# Patient Record
Sex: Female | Born: 1985 | Race: Black or African American | Hispanic: No | Marital: Single | Smoking: Current every day smoker
Health system: Southern US, Community
[De-identification: ages and names within clinical notes are randomized; demographics above are authoritative.]

## PROBLEM LIST (undated history)

## (undated) DIAGNOSIS — F32A Depression, unspecified: Secondary | ICD-10-CM

## (undated) DIAGNOSIS — D649 Anemia, unspecified: Secondary | ICD-10-CM

## (undated) DIAGNOSIS — J189 Pneumonia, unspecified organism: Secondary | ICD-10-CM

## (undated) DIAGNOSIS — I10 Essential (primary) hypertension: Secondary | ICD-10-CM

## (undated) DIAGNOSIS — J45909 Unspecified asthma, uncomplicated: Secondary | ICD-10-CM

## (undated) DIAGNOSIS — F329 Major depressive disorder, single episode, unspecified: Secondary | ICD-10-CM

## (undated) DIAGNOSIS — R51 Headache: Secondary | ICD-10-CM

## (undated) HISTORY — PX: DILATION AND CURETTAGE OF UTERUS: SHX78

---

## 2012-11-05 ENCOUNTER — Emergency Department (HOSPITAL_COMMUNITY)
Admission: EM | Admit: 2012-11-05 | Discharge: 2012-11-05 | Disposition: A | Discharge status: Home / Self Care | Payer: Self-pay | Attending: Emergency Medicine | Admitting: Emergency Medicine

## 2012-11-05 ENCOUNTER — Encounter (HOSPITAL_COMMUNITY): Payer: Self-pay | Admitting: Emergency Medicine

## 2012-11-05 DIAGNOSIS — J45909 Unspecified asthma, uncomplicated: Secondary | ICD-10-CM | POA: Insufficient documentation

## 2012-11-05 DIAGNOSIS — N760 Acute vaginitis: Secondary | ICD-10-CM | POA: Insufficient documentation

## 2012-11-05 DIAGNOSIS — F17200 Nicotine dependence, unspecified, uncomplicated: Secondary | ICD-10-CM | POA: Insufficient documentation

## 2012-11-05 DIAGNOSIS — Z79899 Other long term (current) drug therapy: Secondary | ICD-10-CM | POA: Insufficient documentation

## 2012-11-05 DIAGNOSIS — B9689 Other specified bacterial agents as the cause of diseases classified elsewhere: Secondary | ICD-10-CM

## 2012-11-05 HISTORY — DX: Unspecified asthma, uncomplicated: J45.909

## 2012-11-05 LAB — URINALYSIS, ROUTINE W REFLEX MICROSCOPIC
Bilirubin Urine: NEGATIVE
Glucose, UA: NEGATIVE mg/dL
Ketones, ur: NEGATIVE mg/dL
Nitrite: NEGATIVE
Protein, ur: NEGATIVE mg/dL
Specific Gravity, Urine: 1.028 (ref 1.005–1.030)
Urobilinogen, UA: 1 mg/dL (ref 0.0–1.0)
pH: 5.5 (ref 5.0–8.0)

## 2012-11-05 LAB — WET PREP, GENITAL
Trich, Wet Prep: NONE SEEN
Yeast Wet Prep HPF POC: NONE SEEN

## 2012-11-05 LAB — PREGNANCY, URINE: Preg Test, Ur: NEGATIVE

## 2012-11-05 LAB — URINE MICROSCOPIC-ADD ON

## 2012-11-05 MED ORDER — METRONIDAZOLE 500 MG PO TABS
500.0000 mg | ORAL_TABLET | Freq: Once | ORAL | Status: AC
  Administered 2012-11-05: 500 mg via ORAL
  Filled 2012-11-05: qty 1

## 2012-11-05 MED ORDER — DOXYCYCLINE HYCLATE 100 MG PO TABS
100.0000 mg | ORAL_TABLET | Freq: Once | ORAL | Status: AC
  Administered 2012-11-05: 100 mg via ORAL
  Filled 2012-11-05: qty 1

## 2012-11-05 MED ORDER — METRONIDAZOLE 500 MG PO TABS: 500.0000 mg | ORAL_TABLET | Freq: Two times a day (BID) | ORAL | Status: DC

## 2012-11-05 MED ORDER — DOXYCYCLINE HYCLATE 100 MG PO CAPS: 100.0000 mg | ORAL_CAPSULE | Freq: Two times a day (BID) | ORAL | Status: DC

## 2012-11-05 NOTE — ED Notes (Signed)
States had intercourse 2 weeks ago and condon broke and since she has had vag irritation and now has  Knots and swelling in groin, vag d/c itching a lot  States feels very different  From past yeast infections

## 2012-11-06 LAB — URINE CULTURE: Culture: NO GROWTH

## 2012-11-06 LAB — GC/CHLAMYDIA PROBE AMP
CT Probe RNA: NEGATIVE
GC Probe RNA: NEGATIVE

## 2012-11-06 NOTE — ED Provider Notes (Signed)
History     CSN: [id number]  Arrival date & time 11/05/12  0805   First MD Initiated Contact with Patient 11/05/12 0815      Chief Complaint  Patient presents with  . Vaginal Discharge    (Consider location/radiation/quality/duration/timing/severity/associated sxs/prior treatment) HPI The patient presents with a 2 week hx of vaginal discharge. The patient states that she was having sexual intercourse and the condom broke. The patient states that she has noted pain and irritation of the labia. The patient denies nausea, vomiting, fever, headache, weakness, abdominal pain, vaginal bleeding, or back pain. The patient states that she did not take any medications prior to arrival. The patient does not see on OB/GYN here in [place].  Past Medical History  Diagnosis Date  . Asthma     No past surgical history on file.  No family history on file.  History  Substance Use Topics  . Smoking status: Current Every Day Smoker  . Smokeless tobacco: Not on file  . Alcohol Use: Yes    OB History    Grav Para Term Preterm Abortions TAB SAB Ect Mult Living                  Review of Systems ROS  All other systems negative except as documented in the HPI. All pertinent positives and negatives as reviewed in the HPI.  Allergies  Shellfish allergy  Home Medications   Current Outpatient Rx  Name  Route  Sig  Dispense  Refill  . ALBUTEROL SULFATE HFA 108 (90 BASE) MCG/ACT IN AERS   Inhalation   Inhale 2 puffs into the lungs every 6 (six) hours as needed. For shortness of breath         . ALBUTEROL SULFATE (2.5 MG/3ML) 0.083% IN NEBU   Nebulization   Take 2.5 mg by nebulization every 6 (six) hours as needed. For shortness of breath         . DIPHENHYDRAMINE HCL 25 MG PO CAPS   Oral   Take 25 mg by mouth 2 (two) times daily as needed. For allergies: one capsule 1 hour before meal containing shellfish, 1 capsule 1 hour after meal         . DOXYCYCLINE HYCLATE 100 MG PO CAPS  Oral   Take 1 capsule (100 mg total) by mouth 2 (two) times daily.   28 capsule   0   . METRONIDAZOLE 500 MG PO TABS   Oral   Take 1 tablet (500 mg total) by mouth 2 (two) times daily.   28 tablet   0     BP 116/75  Pulse 76  Temp 97.9 F (36.6 C) (Oral)  Resp 18  SpO2 100%  Physical Exam  Nursing note and vitals reviewed. Constitutional: She is oriented to person, place, and time. She appears well-developed and well-nourished.  HENT:  Head: Normocephalic and atraumatic.  Eyes: Pupils are equal, round, and reactive to light.  Neck: Normal range of motion. Neck supple.  Cardiovascular: Normal rate, regular rhythm and normal heart sounds.  Exam reveals no gallop and no friction rub.   No murmur heard. Pulmonary/Chest: Effort normal and breath sounds normal. She has no wheezes.  Abdominal: Soft. Bowel sounds are normal. She exhibits no distension. There is no tenderness. There is no guarding.  Genitourinary: There is tenderness on the right labia. There is no rash or lesion on the right labia. There is tenderness on the left labia. There is no rash or lesion on  the left labia. Cervix exhibits discharge. Cervix exhibits no motion tenderness and no friability. Right adnexum displays no mass, no tenderness and no fullness. Left adnexum displays no mass, no tenderness and no fullness. There is tenderness around the vagina. No erythema or bleeding around the vagina. No foreign body around the vagina. Vaginal discharge found.  Neurological: She is alert and oriented to person, place, and time.  Skin: Skin is warm and dry. No rash noted.    ED Course  Procedures (including critical care time)  Labs Reviewed  URINALYSIS, ROUTINE W REFLEX MICROSCOPIC - Abnormal; Notable for the following:    APPearance CLOUDY (*)     Hgb urine dipstick MODERATE (*)     Leukocytes, UA MODERATE (*)     All other components within normal limits  WET PREP, GENITAL - Abnormal; Notable for the following:      Clue Cells Wet Prep HPF POC MANY (*)     WBC, Wet Prep HPF POC FEW (*)     All other components within normal limits  URINE MICROSCOPIC-ADD ON - Abnormal; Notable for the following:    Squamous Epithelial / LPF FEW (*)     Bacteria, UA FEW (*)     All other components within normal limits  PREGNANCY, URINE  GC/CHLAMYDIA PROBE AMP, GENITAL  URINE CULTURE  GC/CHLAMYDIA PROBE AMP  GC/CHLAMYDIA PROBE AMP   The patient will be treated for BV and possible STD. The patient is advised to return here as needed.   1. Bacterial vaginosis       MDM          Carlyle Dolly, PA-C 11/08/12 [phone]

## 2012-11-12 NOTE — ED Provider Notes (Signed)
Medical screening examination/treatment/procedure(s) were performed by non-physician practitioner and as supervising physician I was immediately available for consultation/collaboration.  Praise Dolecki R. Keiasia Christianson, MD 11/12/12 0751 

## 2012-12-31 NOTE — L&D Delivery Note (Signed)
Delivery Note At 9:24 PM a viable female was delivered via  (Presentation: ;  ).  APGAR: , ; weight .   Placenta status: , .  Cord:  with the following complications: .  Cord pH: not done  Anesthesia:   Episiotomy:  Lacerations:  Suture Repair: 2.0 Est. Blood Loss (mL):   Mom to postpartum.  Baby to nursery-stable.  MARSHALL,BERNARD A 09/14/2013, 9:33 PM

## 2013-02-05 ENCOUNTER — Emergency Department (HOSPITAL_COMMUNITY)
Admission: EM | Admit: 2013-02-05 | Discharge: 2013-02-05 | Disposition: A | Discharge status: Home / Self Care | Payer: Medicaid Other | Attending: Emergency Medicine | Admitting: Emergency Medicine

## 2013-02-05 ENCOUNTER — Encounter (HOSPITAL_COMMUNITY): Payer: Self-pay | Admitting: Cardiology

## 2013-02-05 DIAGNOSIS — O98819 Other maternal infectious and parasitic diseases complicating pregnancy, unspecified trimester: Secondary | ICD-10-CM | POA: Insufficient documentation

## 2013-02-05 DIAGNOSIS — Z3201 Encounter for pregnancy test, result positive: Secondary | ICD-10-CM

## 2013-02-05 DIAGNOSIS — Z79899 Other long term (current) drug therapy: Secondary | ICD-10-CM | POA: Insufficient documentation

## 2013-02-05 DIAGNOSIS — O99330 Smoking (tobacco) complicating pregnancy, unspecified trimester: Secondary | ICD-10-CM | POA: Insufficient documentation

## 2013-02-05 DIAGNOSIS — R110 Nausea: Secondary | ICD-10-CM | POA: Insufficient documentation

## 2013-02-05 DIAGNOSIS — B9689 Other specified bacterial agents as the cause of diseases classified elsewhere: Secondary | ICD-10-CM

## 2013-02-05 DIAGNOSIS — N912 Amenorrhea, unspecified: Secondary | ICD-10-CM | POA: Insufficient documentation

## 2013-02-05 DIAGNOSIS — J45909 Unspecified asthma, uncomplicated: Secondary | ICD-10-CM | POA: Insufficient documentation

## 2013-02-05 DIAGNOSIS — N760 Acute vaginitis: Secondary | ICD-10-CM | POA: Insufficient documentation

## 2013-02-05 LAB — URINALYSIS, ROUTINE W REFLEX MICROSCOPIC
Glucose, UA: NEGATIVE mg/dL
Hgb urine dipstick: NEGATIVE
Ketones, ur: 15 mg/dL — AB
Protein, ur: 30 mg/dL — AB
Urobilinogen, UA: 1 mg/dL (ref 0.0–1.0)

## 2013-02-05 LAB — URINE MICROSCOPIC-ADD ON

## 2013-02-05 MED ORDER — ONDANSETRON HCL 4 MG PO TABS: 4.0000 mg | ORAL_TABLET | Freq: Three times a day (TID) | ORAL | Status: DC | PRN

## 2013-02-05 MED ORDER — METRONIDAZOLE 500 MG PO TABS: 500.0000 mg | ORAL_TABLET | Freq: Two times a day (BID) | ORAL | Status: DC

## 2013-02-05 MED ORDER — CEFTRIAXONE SODIUM 250 MG IJ SOLR: 250.0000 mg | Freq: Once | INTRAMUSCULAR | Status: DC

## 2013-02-05 MED ORDER — AZITHROMYCIN 250 MG PO TABS: 1000.0000 mg | ORAL_TABLET | Freq: Once | ORAL | Status: DC

## 2013-02-05 MED ORDER — ONDANSETRON 4 MG PO TBDP
4.0000 mg | ORAL_TABLET | Freq: Once | ORAL | Status: AC
  Administered 2013-02-05: 4 mg via ORAL
  Filled 2013-02-05: qty 1

## 2013-02-05 NOTE — ED Notes (Signed)
Correction...POCT pregnacy test is POSITIVE.  [email or web address] emt

## 2013-02-05 NOTE — ED Notes (Signed)
Pt reports n/v for the past couple of weeks. States that she has been unable to keep any fluids down and just feeling sick on her stomach. Thinks that she could be pregnant and wants a pregnancy test.

## 2013-02-05 NOTE — ED Notes (Signed)
Pt discharged.Vital signs stable and GCS 15 

## 2013-02-05 NOTE — ED Provider Notes (Signed)
History     CSN: [id number]  Arrival date & time 02/05/13  1739   First MD Initiated Contact with Patient 02/05/13 2053      Chief Complaint  Patient presents with  . Emesis    (Consider location/radiation/quality/duration/timing/severity/associated sxs/prior treatment) HPI Comments: 28 y.o. Female  [id number] presents today after completing a positive home pregnancy test. She would like confirmation of the pregnancy and treatment for nausea. She has taken no interventions for the nausea. Nothing makes it better. Pt admits unprotected sex, amenorrhea, lack of appetite over the last few weeks, and some mild abdominal cramping. Pt denies vaginal discharge/bleeding/odor, hematuria, vomiting, or diarrhea. Pt states LMP was in December.   Obstetrics history includes a miscarriage within 6 weeks of conception followed 2 term births (uncomplicated, NSVD).       Past Medical History  Diagnosis Date  . Asthma     History reviewed. No pertinent past surgical history.  History reviewed. No pertinent family history.  History  Substance Use Topics  . Smoking status: Current Every Day Smoker  . Smokeless tobacco: Not on file  . Alcohol Use: Yes    OB History    Grav Para Term Preterm Abortions TAB SAB Ect Mult Living                  Review of Systems  Constitutional: Positive for appetite change. Negative for fever and diaphoresis.       Anorexia  HENT: Negative for neck pain and neck stiffness.   Eyes: Negative for visual disturbance.  Respiratory: Negative for apnea, chest tightness and shortness of breath.   Cardiovascular: Negative for chest pain and palpitations.  Gastrointestinal: Positive for nausea. Negative for vomiting, diarrhea and constipation.       Mild abdominal cramping  Genitourinary: Positive for menstrual problem. Negative for dysuria, hematuria, vaginal bleeding, vaginal discharge, vaginal pain and pelvic pain.       Amenorrhea  Musculoskeletal: Negative for  gait problem.  Skin: Negative for rash.  Neurological: Negative for dizziness, weakness, light-headedness, numbness and headaches.    Allergies  Shellfish allergy  Home Medications   Current Outpatient Rx  Name  Route  Sig  Dispense  Refill  . ALBUTEROL SULFATE HFA 108 (90 BASE) MCG/ACT IN AERS   Inhalation   Inhale 2 puffs into the lungs every 6 (six) hours as needed. For shortness of breath         . ALBUTEROL SULFATE (2.5 MG/3ML) 0.083% IN NEBU   Nebulization   Take 2.5 mg by nebulization every 6 (six) hours as needed. For shortness of breath         . DIPHENHYDRAMINE HCL 25 MG PO CAPS   Oral   Take 25 mg by mouth 2 (two) times daily as needed. For allergies: one capsule 1 hour before meal containing shellfish, 1 capsule 1 hour after meal         . DOXYCYCLINE HYCLATE 100 MG PO CAPS   Oral   Take 1 capsule (100 mg total) by mouth 2 (two) times daily.   28 capsule   0   . METRONIDAZOLE 500 MG PO TABS   Oral   Take 1 tablet (500 mg total) by mouth 2 (two) times daily.   28 tablet   0     BP 105/75  Pulse 94  Temp 98.6 F (37 C) (Oral)  Resp 18  SpO2 100%  LMP 01/07/2013  Physical Exam  Nursing note and vitals reviewed. Constitutional:  She is oriented to person, place, and time. She appears well-developed and well-nourished. No distress.  HENT:  Head: Normocephalic and atraumatic.  Eyes: Conjunctivae normal and EOM are normal.  Neck: Normal range of motion. Neck supple.       No meningeal signs  Cardiovascular: Normal rate, regular rhythm and normal heart sounds.  Exam reveals no gallop and no friction rub.   No murmur heard. Pulmonary/Chest: Effort normal and breath sounds normal. No respiratory distress. She has no wheezes. She has no rales. She exhibits no tenderness.  Abdominal: Soft. Bowel sounds are normal. She exhibits no distension. There is no tenderness. There is no rebound and no guarding.  Genitourinary: Vagina normal. No vaginal discharge  found.       White cervical discharge, no cervical motion tenderness, no adnexal tenderness, no external lesions  Musculoskeletal: Normal range of motion. She exhibits no edema and no tenderness.  Neurological: She is alert and oriented to person, place, and time. No cranial nerve deficit.  Skin: Skin is warm and dry. She is not diaphoretic. No erythema.    ED Course  Procedures (including critical care time)  Labs Reviewed  URINALYSIS, ROUTINE W REFLEX MICROSCOPIC - Abnormal; Notable for the following:    Color, Urine AMBER (*)  BIOCHEMICALS MAY BE AFFECTED BY COLOR   APPearance CLOUDY (*)     Specific Gravity, Urine 1.035 (*)     Bilirubin Urine SMALL (*)     Ketones, ur 15 (*)     Protein, ur 30 (*)     Leukocytes, UA SMALL (*)     All other components within normal limits  POCT PREGNANCY, URINE - Abnormal; Notable for the following:    Preg Test, Ur POSITIVE (*)     All other components within normal limits  URINE MICROSCOPIC-ADD ON - Abnormal; Notable for the following:    Squamous Epithelial / LPF MANY (*)     Bacteria, UA FEW (*)     All other components within normal limits  POCT PREGNANCY, URINE   No results found. Results for orders placed during the hospital encounter of 02/05/13  URINALYSIS, ROUTINE W REFLEX MICROSCOPIC      Component Value Range   Color, Urine AMBER (*) YELLOW   APPearance CLOUDY (*) CLEAR   Specific Gravity, Urine 1.035 (*) 1.005 - 1.030   pH 5.5  5.0 - 8.0   Glucose, UA NEGATIVE  NEGATIVE mg/dL   Hgb urine dipstick NEGATIVE  NEGATIVE   Bilirubin Urine SMALL (*) NEGATIVE   Ketones, ur 15 (*) NEGATIVE mg/dL   Protein, ur 30 (*) NEGATIVE mg/dL   Urobilinogen, UA 1.0  0.0 - 1.0 mg/dL   Nitrite NEGATIVE  NEGATIVE   Leukocytes, UA SMALL (*) NEGATIVE  POCT PREGNANCY, URINE      Component Value Range   Preg Test, Ur POSITIVE (*) NEGATIVE  POCT PREGNANCY, URINE      Component Value Range   Preg Test, Ur POSITIVE (*) NEGATIVE  URINE  MICROSCOPIC-ADD ON      Component Value Range   Squamous Epithelial / LPF MANY (*) RARE   WBC, UA 0-2  <3 WBC/hpf   RBC / HPF 0-2  <3 RBC/hpf   Bacteria, UA FEW (*) RARE  GC/CHLAMYDIA PROBE AMP      Component Value Range   CT Probe RNA NEGATIVE  NEGATIVE   GC Probe RNA NEGATIVE  NEGATIVE  WET PREP, GENITAL      Component Value Range  Yeast Wet Prep HPF POC NONE SEEN  NONE SEEN   Trich, Wet Prep NONE SEEN  NONE SEEN   Clue Cells Wet Prep HPF POC FEW (*) NONE SEEN   WBC, Wet Prep HPF POC FEW (*) NONE SEEN     No diagnosis found.    MDM  Pt c/o cramping so will do pelvic exam to see if clinical suspicion for ectopic is raised. Pelvic exam negative for CMT/adenexal tenderness, positive for white cervical discharge. Wet prep positive for clue cells. Will prescribe Flagyl and Zofran. Pt asked for information on elective termination. Also requested STD testing once boyfriend stepped out of room. Provided resource guide.  At this time there does not appear to be any evidence of an acute emergency medical condition and the patient appears stable for discharge with appropriate outpatient follow up.Diagnosis was discussed with patient who verbalizes understanding and is agreeable to discharge. Pt case discussed with Dr. Rubin Payor who agrees with my plan.    Glade Nurse, PA-C 02/06/13 2150

## 2013-02-06 LAB — GC/CHLAMYDIA PROBE AMP
CT Probe RNA: NEGATIVE
GC Probe RNA: NEGATIVE

## 2013-02-09 NOTE — ED Provider Notes (Signed)
Medical screening examination/treatment/procedure(s) were performed by non-physician practitioner and as supervising physician I was immediately available for consultation/collaboration.  Juliet Rude. Rubin Payor, MD 02/09/13 2011

## 2013-03-02 LAB — OB RESULTS CONSOLE RUBELLA ANTIBODY, IGM: Rubella: IMMUNE

## 2013-03-02 LAB — OB RESULTS CONSOLE ABO/RH: RH Type: POSITIVE

## 2013-03-02 LAB — OB RESULTS CONSOLE RPR: RPR: NONREACTIVE

## 2013-03-02 LAB — OB RESULTS CONSOLE HEPATITIS B SURFACE ANTIGEN: Hepatitis B Surface Ag: NEGATIVE

## 2013-03-02 LAB — OB RESULTS CONSOLE HIV ANTIBODY (ROUTINE TESTING): HIV: NONREACTIVE

## 2013-03-30 LAB — OB RESULTS CONSOLE ANTIBODY SCREEN: Antibody Screen: NEGATIVE

## 2013-05-26 ENCOUNTER — Other Ambulatory Visit: Payer: Self-pay | Admitting: Obstetrics

## 2013-05-26 DIAGNOSIS — N6320 Unspecified lump in the left breast, unspecified quadrant: Secondary | ICD-10-CM

## 2013-06-01 ENCOUNTER — Other Ambulatory Visit: Payer: Self-pay | Admitting: Obstetrics

## 2013-06-01 ENCOUNTER — Ambulatory Visit
Admission: RE | Admit: 2013-06-01 | Discharge: 2013-06-01 | Disposition: A | Discharge status: Home / Self Care | Payer: Medicaid Other | Source: Ambulatory Visit | Attending: Obstetrics | Admitting: Obstetrics

## 2013-06-01 DIAGNOSIS — N6320 Unspecified lump in the left breast, unspecified quadrant: Secondary | ICD-10-CM

## 2013-06-03 HISTORY — PX: BREAST BIOPSY: SHX20

## 2013-09-10 ENCOUNTER — Encounter (HOSPITAL_COMMUNITY): Payer: Self-pay | Admitting: Emergency Medicine

## 2013-09-10 ENCOUNTER — Emergency Department (HOSPITAL_COMMUNITY)
Admission: EM | Admit: 2013-09-10 | Discharge: 2013-09-10 | Disposition: A | Discharge status: Home / Self Care | Payer: Medicaid Other | Attending: Emergency Medicine | Admitting: Emergency Medicine

## 2013-09-10 DIAGNOSIS — O99330 Smoking (tobacco) complicating pregnancy, unspecified trimester: Secondary | ICD-10-CM | POA: Insufficient documentation

## 2013-09-10 DIAGNOSIS — IMO0002 Reserved for concepts with insufficient information to code with codable children: Secondary | ICD-10-CM | POA: Insufficient documentation

## 2013-09-10 DIAGNOSIS — Z3490 Encounter for supervision of normal pregnancy, unspecified, unspecified trimester: Secondary | ICD-10-CM

## 2013-09-10 DIAGNOSIS — Z79899 Other long term (current) drug therapy: Secondary | ICD-10-CM | POA: Insufficient documentation

## 2013-09-10 DIAGNOSIS — W57XXXA Bitten or stung by nonvenomous insect and other nonvenomous arthropods, initial encounter: Secondary | ICD-10-CM

## 2013-09-10 DIAGNOSIS — J45909 Unspecified asthma, uncomplicated: Secondary | ICD-10-CM | POA: Insufficient documentation

## 2013-09-10 DIAGNOSIS — S60569A Insect bite (nonvenomous) of unspecified hand, initial encounter: Secondary | ICD-10-CM | POA: Insufficient documentation

## 2013-09-10 DIAGNOSIS — Y929 Unspecified place or not applicable: Secondary | ICD-10-CM | POA: Insufficient documentation

## 2013-09-10 DIAGNOSIS — O9989 Other specified diseases and conditions complicating pregnancy, childbirth and the puerperium: Secondary | ICD-10-CM | POA: Insufficient documentation

## 2013-09-10 DIAGNOSIS — Y939 Activity, unspecified: Secondary | ICD-10-CM | POA: Insufficient documentation

## 2013-09-10 MED ORDER — PERMETHRIN 5 % EX CREA: TOPICAL_CREAM | CUTANEOUS | Status: DC

## 2013-09-10 MED ORDER — DIPHENHYDRAMINE HCL 50 MG/ML IJ SOLN
25.0000 mg | Freq: Once | INTRAMUSCULAR | Status: AC
  Administered 2013-09-10: 25 mg via INTRAMUSCULAR
  Filled 2013-09-10: qty 1

## 2013-09-10 MED ORDER — DIPHENHYDRAMINE HCL 25 MG PO CAPS: 25.0000 mg | ORAL_CAPSULE | Freq: Four times a day (QID) | ORAL | Status: DC | PRN

## 2013-09-10 NOTE — ED Provider Notes (Signed)
CSN: [id number]     Arrival date & time 09/10/13  1338 History  This chart was scribed for non-physician practitioner Marlon Pel, PA-C, working with Gwyneth Sprout, MD by Dorothey Baseman, ED Scribe. This patient was seen in room TR07C/TR07C and the patient's care was started at 3:09 PM.     Chief Complaint  Patient presents with  . Rash    The history is provided by the patient. No language interpreter was used.   HPI Comments: Nicole Fry is a 28 y.o. female who presents to the Emergency Department complaining of a rash to her left palm and foot described as burning and itching that exacerbates when walking with associated swelling. She reports that he fingers have began to feel numb today. She reports that she may have been bitten by a a spider. Patient reports her son had a similar rash a few days ago that she treated with Hydrocortisone cream and Benadryl with relief. She states that she has tried Hydrocortisone cream and itching powder without relief and that she was unsure if she can take Benadryl. Patient denies any other symptoms at this time. Patient is currently 9 months pregnant.  Past Medical History  Diagnosis Date  . Asthma    History reviewed. No pertinent past surgical history. History reviewed. No pertinent family history. History  Substance Use Topics  . Smoking status: Current Every Day Smoker  . Smokeless tobacco: Not on file  . Alcohol Use: Yes   OB History   Grav Para Term Preterm Abortions TAB SAB Ect Mult Living                 Review of Systems  A complete 10 system review of systems was obtained and all systems are negative except as noted in the HPI and PMH.   Allergies  Shellfish allergy  Home Medications   Current Outpatient Rx  Name  Route  Sig  Dispense  Refill  . albuterol (PROVENTIL HFA;VENTOLIN HFA) 108 (90 BASE) MCG/ACT inhaler   Inhalation   Inhale 2 puffs into the lungs every 6 (six) hours as needed for wheezing or shortness of breath  (coughing). For shortness of breath         . Calcium Carbonate Antacid (TUMS PO)   Oral   Take 3 tablets by mouth 2 (two) times daily as needed (heartburn).         . Prenatal Vit-Fe Sulfate-FA (PRENATAL VITAMIN PO)   Oral   Take 1 tablet by mouth daily.         . diphenhydrAMINE (BENADRYL) 25 mg capsule   Oral   Take 1 capsule (25 mg total) by mouth every 6 (six) hours as needed for itching.   10 capsule   0   . permethrin (ELIMITE) 5 % cream      Apply to affected area once and leave for 14 hours   60 g   1    Triage Vitals: BP 121/80  Pulse 114  Temp(Src) 98.3 F (36.8 C) (Oral)  Resp 18  SpO2 99%  Physical Exam  Nursing note and vitals reviewed. Constitutional: She is oriented to person, place, and time. She appears well-developed and well-nourished. No distress.  HENT:  Head: Normocephalic and atraumatic.  Eyes: Conjunctivae are normal.  Neck: Normal range of motion. Neck supple.  Musculoskeletal: Normal range of motion.  Neurological: She is alert and oriented to person, place, and time.  Skin: Skin is warm and dry. Rash noted.  Rash to  palm of left hand and bottom of left foot. Mild swelling to both areas noted.  Psychiatric: She has a normal mood and affect. Her behavior is normal.     ED Course  Procedures (including critical care time)  DIAGNOSTIC STUDIES: Oxygen Saturation is 99% on room air, normal by my interpretation.    COORDINATION OF CARE: 3:12PM- Will order a shot of Benadryl. Will discharge patient with Permethrin and advised her of proper use. Advised patient to stop scratching until symptoms subside. Discussed treatment plan with patient at bedside and patient verbalized agreement.     Labs Review Labs Reviewed - No data to display Imaging Review No results found.  MDM   1. Pregnant   2. Insect bites    [employer] reports the bendaryl is category B. Pt advised to take it only if she REALLY needs it.   Given Rx for  Permethrin cream which is also Category B.  27 y.o.Nicole Fry evaluation in the Emergency Department is complete. It has been determined that no acute conditions requiring further emergency intervention are present at this time. The patient/guardian have been advised of the diagnosis and plan. We have discussed signs and symptoms that warrant return to the ED, such as changes or worsening in symptoms.  Vital signs are stable at discharge. Filed Vitals:   09/10/13 1343  BP: 121/80  Pulse: 114  Temp: 98.3 F (36.8 C)  Resp: 18    Patient/guardian has voiced understanding and agreed to follow-up with the PCP or specialist.  I personally performed the services described in this documentation, which was scribed in my presence. The recorded information has been reviewed and is accurate.     Dorthula Matas, PA-C 09/10/13 1521

## 2013-09-10 NOTE — ED Provider Notes (Signed)
Medical screening examination/treatment/procedure(s) were performed by non-physician practitioner and as supervising physician I was immediately available for consultation/collaboration.   Gwyneth Sprout, MD 09/10/13 [phone]

## 2013-09-10 NOTE — ED Notes (Signed)
As per triage. Pt is in no distress.

## 2013-09-10 NOTE — ED Notes (Signed)
Pt c/o painful, burning rash to hands, feet, and mouth; pt sts son had similar; pt is 9 months pregnant at present

## 2013-09-14 ENCOUNTER — Inpatient Hospital Stay (HOSPITAL_COMMUNITY)
Admission: AD | Admit: 2013-09-14 | Discharge: 2013-09-16 | DRG: 775 | Disposition: A | Discharge status: Home / Self Care | Payer: Medicaid Other | Source: Ambulatory Visit | Attending: Obstetrics | Admitting: Obstetrics

## 2013-09-14 ENCOUNTER — Inpatient Hospital Stay (HOSPITAL_COMMUNITY): Payer: Medicaid Other | Admitting: Anesthesiology

## 2013-09-14 ENCOUNTER — Encounter (HOSPITAL_COMMUNITY): Payer: Self-pay

## 2013-09-14 ENCOUNTER — Encounter (HOSPITAL_COMMUNITY): Payer: Self-pay | Admitting: Anesthesiology

## 2013-09-14 DIAGNOSIS — Z22330 Carrier of Group B streptococcus: Secondary | ICD-10-CM

## 2013-09-14 DIAGNOSIS — O99892 Other specified diseases and conditions complicating childbirth: Principal | ICD-10-CM | POA: Diagnosis present

## 2013-09-14 HISTORY — DX: Headache: R51

## 2013-09-14 HISTORY — DX: Essential (primary) hypertension: I10

## 2013-09-14 HISTORY — DX: Depression, unspecified: F32.A

## 2013-09-14 HISTORY — DX: Major depressive disorder, single episode, unspecified: F32.9

## 2013-09-14 HISTORY — DX: Anemia, unspecified: D64.9

## 2013-09-14 LAB — CBC
Hemoglobin: 10.8 g/dL — ABNORMAL LOW (ref 12.0–15.0)
MCH: 30.1 pg (ref 26.0–34.0)
MCHC: 33 g/dL (ref 30.0–36.0)
Platelets: 202 10*3/uL (ref 150–400)

## 2013-09-14 LAB — RPR: RPR Ser Ql: NONREACTIVE

## 2013-09-14 MED ORDER — CITRIC ACID-SODIUM CITRATE 334-500 MG/5ML PO SOLN: 30.0000 mL | ORAL | Status: DC | PRN

## 2013-09-14 MED ORDER — FENTANYL 2.5 MCG/ML BUPIVACAINE 1/10 % EPIDURAL INFUSION (WH - ANES)
INTRAMUSCULAR | Status: DC | PRN
  Administered 2013-09-14: 14 mL/h via EPIDURAL

## 2013-09-14 MED ORDER — OXYTOCIN 40 UNITS IN LACTATED RINGERS INFUSION - SIMPLE MED
1.0000 m[IU]/min | INTRAVENOUS | Status: DC
  Administered 2013-09-14: 2 m[IU]/min via INTRAVENOUS
  Filled 2013-09-14: qty 1000

## 2013-09-14 MED ORDER — PHENYLEPHRINE 40 MCG/ML (10ML) SYRINGE FOR IV PUSH (FOR BLOOD PRESSURE SUPPORT)
80.0000 ug | PREFILLED_SYRINGE | INTRAVENOUS | Status: DC | PRN
  Filled 2013-09-14: qty 2

## 2013-09-14 MED ORDER — FENTANYL 2.5 MCG/ML BUPIVACAINE 1/10 % EPIDURAL INFUSION (WH - ANES)
14.0000 mL/h | INTRAMUSCULAR | Status: DC | PRN
  Filled 2013-09-14: qty 125

## 2013-09-14 MED ORDER — LACTATED RINGERS IV SOLN
INTRAVENOUS | Status: DC
  Administered 2013-09-14: 15:00:00 via INTRAVENOUS

## 2013-09-14 MED ORDER — EPHEDRINE 5 MG/ML INJ
10.0000 mg | INTRAVENOUS | Status: DC | PRN
  Filled 2013-09-14: qty 2
  Filled 2013-09-14: qty 4

## 2013-09-14 MED ORDER — LIDOCAINE HCL (PF) 1 % IJ SOLN
30.0000 mL | INTRAMUSCULAR | Status: DC | PRN
  Filled 2013-09-14 (×2): qty 30

## 2013-09-14 MED ORDER — OXYCODONE-ACETAMINOPHEN 5-325 MG PO TABS: 1.0000 | ORAL_TABLET | ORAL | Status: DC | PRN

## 2013-09-14 MED ORDER — FLEET ENEMA 7-19 GM/118ML RE ENEM
1.0000 | ENEMA | RECTAL | Status: DC | PRN
Start: 2013-09-14 — End: 2013-09-15

## 2013-09-14 MED ORDER — SODIUM CHLORIDE 0.9 % IV SOLN: 1.0000 g | Freq: Once | INTRAVENOUS | Status: DC

## 2013-09-14 MED ORDER — OXYTOCIN 40 UNITS IN LACTATED RINGERS INFUSION - SIMPLE MED: 62.5000 mL/h | INTRAVENOUS | Status: DC

## 2013-09-14 MED ORDER — PHENYLEPHRINE 40 MCG/ML (10ML) SYRINGE FOR IV PUSH (FOR BLOOD PRESSURE SUPPORT)
80.0000 ug | PREFILLED_SYRINGE | INTRAVENOUS | Status: DC | PRN
  Filled 2013-09-14: qty 2
  Filled 2013-09-14: qty 5

## 2013-09-14 MED ORDER — LACTATED RINGERS IV SOLN: 500.0000 mL | INTRAVENOUS | Status: DC | PRN

## 2013-09-14 MED ORDER — BUTORPHANOL TARTRATE 1 MG/ML IJ SOLN: 1.0000 mg | INTRAMUSCULAR | Status: DC | PRN

## 2013-09-14 MED ORDER — EPHEDRINE 5 MG/ML INJ
10.0000 mg | INTRAVENOUS | Status: DC | PRN
  Filled 2013-09-14: qty 2

## 2013-09-14 MED ORDER — SODIUM CHLORIDE 0.9 % IV SOLN
2.0000 g | Freq: Four times a day (QID) | INTRAVENOUS | Status: DC
  Administered 2013-09-14: 2 g via INTRAVENOUS
  Filled 2013-09-14 (×3): qty 2000

## 2013-09-14 MED ORDER — ONDANSETRON HCL 4 MG/2ML IJ SOLN: 4.0000 mg | Freq: Four times a day (QID) | INTRAMUSCULAR | Status: DC | PRN

## 2013-09-14 MED ORDER — OXYTOCIN BOLUS FROM INFUSION
500.0000 mL | INTRAVENOUS | Status: DC
  Administered 2013-09-14: 500 mL via INTRAVENOUS

## 2013-09-14 MED ORDER — LACTATED RINGERS IV SOLN
500.0000 mL | Freq: Once | INTRAVENOUS | Status: AC
  Administered 2013-09-14: 500 mL via INTRAVENOUS

## 2013-09-14 MED ORDER — TERBUTALINE SULFATE 1 MG/ML IJ SOLN: 0.2500 mg | Freq: Once | INTRAMUSCULAR | Status: AC | PRN

## 2013-09-14 MED ORDER — LIDOCAINE HCL (PF) 1 % IJ SOLN
INTRAMUSCULAR | Status: DC | PRN
  Administered 2013-09-14 (×2): 4 mL

## 2013-09-14 MED ORDER — ACETAMINOPHEN 325 MG PO TABS: 650.0000 mg | ORAL_TABLET | ORAL | Status: DC | PRN

## 2013-09-14 MED ORDER — DIPHENHYDRAMINE HCL 50 MG/ML IJ SOLN: 12.5000 mg | INTRAMUSCULAR | Status: DC | PRN

## 2013-09-14 MED ORDER — IBUPROFEN 600 MG PO TABS
600.0000 mg | ORAL_TABLET | Freq: Four times a day (QID) | ORAL | Status: DC | PRN
  Administered 2013-09-14: 600 mg via ORAL
  Filled 2013-09-14: qty 1

## 2013-09-14 NOTE — Anesthesia Procedure Notes (Signed)
Epidural Patient location during procedure: OB Start time: 09/14/2013 7:59 PM  Staffing Anesthesiologist: Charniece Venturino A. Performed by: anesthesiologist   Preanesthetic Checklist Completed: patient identified, site marked, surgical consent, pre-op evaluation, timeout performed, IV checked, risks and benefits discussed and monitors and equipment checked  Epidural Patient position: sitting Prep: site prepped and draped and DuraPrep Patient monitoring: continuous pulse ox and blood pressure Approach: midline Injection technique: LOR air  Needle:  Needle type: Tuohy  Needle gauge: 17 G Needle length: 9 cm and 9 Needle insertion depth: 4 cm Catheter type: closed end flexible Catheter size: 19 Gauge Catheter at skin depth: 9 cm Test dose: negative and Other  Assessment Events: blood not aspirated, injection not painful, no injection resistance, negative IV test and no paresthesia  Additional Notes Patient identified. Risks and benefits discussed including failed block, incomplete  Pain control, post dural puncture headache, nerve damage, paralysis, blood pressure Changes, nausea, vomiting, reactions to medications-both toxic and allergic and post Partum back pain. All questions were answered. Patient expressed understanding and wished to proceed. Sterile technique was used throughout procedure. Epidural site was Dressed with sterile barrier dressing. No paresthesias, signs of intravascular injection Or signs of intrathecal spread were encountered.  Patient was more comfortable after the epidural was dosed. Please see RN's note for documentation of vital signs and FHR which are stable.

## 2013-09-14 NOTE — Anesthesia Preprocedure Evaluation (Signed)
Anesthesia Evaluation  Patient identified by MRN, date of birth, ID band Patient awake    Reviewed: Allergy & Precautions, H&P , Patient's Chart, lab work & pertinent test results  Airway Mallampati: III TM Distance: >3 FB Neck ROM: Full    Dental no notable dental hx. (+) Teeth Intact   Pulmonary asthma , former smoker,  breath sounds clear to auscultation  Pulmonary exam normal       Cardiovascular hypertension, Rhythm:Regular Rate:Normal     Neuro/Psych  Headaches, PSYCHIATRIC DISORDERS Depression    GI/Hepatic Neg liver ROS, GERD-  Medicated and Controlled,  Endo/Other  negative endocrine ROS  Renal/GU negative Renal ROS  negative genitourinary   Musculoskeletal negative musculoskeletal ROS (+)   Abdominal   Peds  Hematology  (+) Blood dyscrasia, anemia ,   Anesthesia Other Findings   Reproductive/Obstetrics (+) Pregnancy                           Anesthesia Physical Anesthesia Plan  ASA: II  Anesthesia Plan: Epidural   Post-op Pain Management:    Induction:   Airway Management Planned: Natural Airway  Additional Equipment:   Intra-op Plan:   Post-operative Plan:   Informed Consent: I have reviewed the patients History and Physical, chart, labs and discussed the procedure including the risks, benefits and alternatives for the proposed anesthesia with the patient or authorized representative who has indicated his/her understanding and acceptance.     Plan Discussed with: Anesthesiologist  Anesthesia Plan Comments:         Anesthesia Quick Evaluation

## 2013-09-14 NOTE — MAU Note (Signed)
Patient states she has been leaking yellowish fluid for about 3 days and this am saw bloody show. States she is having contractions every 3 minutes. Reports good fetal movement.

## 2013-09-15 LAB — CBC
MCHC: 33.7 g/dL (ref 30.0–36.0)
Platelets: 180 10*3/uL (ref 150–400)
RDW: 14.7 % (ref 11.5–15.5)

## 2013-09-15 MED ORDER — ONDANSETRON HCL 4 MG/2ML IJ SOLN: 4.0000 mg | INTRAMUSCULAR | Status: DC | PRN

## 2013-09-15 MED ORDER — LANOLIN HYDROUS EX OINT: TOPICAL_OINTMENT | CUTANEOUS | Status: DC | PRN

## 2013-09-15 MED ORDER — DIBUCAINE 1 % RE OINT: 1.0000 "application " | TOPICAL_OINTMENT | RECTAL | Status: DC | PRN

## 2013-09-15 MED ORDER — FERROUS SULFATE 325 (65 FE) MG PO TABS
325.0000 mg | ORAL_TABLET | Freq: Two times a day (BID) | ORAL | Status: DC
  Administered 2013-09-15 – 2013-09-16 (×3): 325 mg via ORAL
  Filled 2013-09-15 (×3): qty 1

## 2013-09-15 MED ORDER — TETANUS-DIPHTH-ACELL PERTUSSIS 5-2.5-18.5 LF-MCG/0.5 IM SUSP
0.5000 mL | Freq: Once | INTRAMUSCULAR | Status: AC
  Administered 2013-09-15: 0.5 mL via INTRAMUSCULAR
  Filled 2013-09-15: qty 0.5

## 2013-09-15 MED ORDER — ZOLPIDEM TARTRATE 5 MG PO TABS: 5.0000 mg | ORAL_TABLET | Freq: Every evening | ORAL | Status: DC | PRN

## 2013-09-15 MED ORDER — BENZOCAINE-MENTHOL 20-0.5 % EX AERO
1.0000 "application " | INHALATION_SPRAY | CUTANEOUS | Status: DC | PRN
  Administered 2013-09-15: 1 via TOPICAL
  Filled 2013-09-15: qty 56

## 2013-09-15 MED ORDER — PRENATAL MULTIVITAMIN CH
1.0000 | ORAL_TABLET | Freq: Every day | ORAL | Status: DC
  Administered 2013-09-15 – 2013-09-16 (×2): 1 via ORAL
  Filled 2013-09-15 (×2): qty 1

## 2013-09-15 MED ORDER — SENNOSIDES-DOCUSATE SODIUM 8.6-50 MG PO TABS
2.0000 | ORAL_TABLET | ORAL | Status: DC
  Administered 2013-09-15: 2 via ORAL

## 2013-09-15 MED ORDER — OXYCODONE-ACETAMINOPHEN 5-325 MG PO TABS
1.0000 | ORAL_TABLET | ORAL | Status: DC | PRN
  Administered 2013-09-15 – 2013-09-16 (×4): 1 via ORAL
  Filled 2013-09-15 (×4): qty 1

## 2013-09-15 MED ORDER — WITCH HAZEL-GLYCERIN EX PADS: 1.0000 "application " | MEDICATED_PAD | CUTANEOUS | Status: DC | PRN

## 2013-09-15 MED ORDER — IBUPROFEN 600 MG PO TABS
600.0000 mg | ORAL_TABLET | Freq: Four times a day (QID) | ORAL | Status: DC
  Administered 2013-09-15 – 2013-09-16 (×6): 600 mg via ORAL
  Filled 2013-09-15 (×6): qty 1

## 2013-09-15 MED ORDER — ONDANSETRON HCL 4 MG PO TABS: 4.0000 mg | ORAL_TABLET | ORAL | Status: DC | PRN

## 2013-09-15 MED ORDER — SIMETHICONE 80 MG PO CHEW: 80.0000 mg | CHEWABLE_TABLET | ORAL | Status: DC | PRN

## 2013-09-15 MED ORDER — DIPHENHYDRAMINE HCL 25 MG PO CAPS: 25.0000 mg | ORAL_CAPSULE | Freq: Four times a day (QID) | ORAL | Status: DC | PRN

## 2013-09-15 NOTE — H&P (Signed)
This is Dr. Francoise Ceo dictating the history and physical on  Nicole Fry she's a 28 year old gravida 6 para 100 old 60 at 40 weeks and 4 days who was admitted in labor with a positive GBS treated with ampicillin her cervix was 6 cm 90% vertex 0 station she made rapid progress and had a normal vaginal delivery Apgar 9 and 9 placenta spontaneous no lacerations Past medical history negative Past surgical history negative Social history negative System review noncontributory Physical exam well-developed female postpartum HEENT negative Lungs clear to P&A Heart regular rhythm no murmurs no gallops Breasts negative Abdomen uterus 20 week postpartum size Extremities negative

## 2013-09-15 NOTE — Clinical Social Work Maternal (Signed)
Clinical Social Work Department  PSYCHOSOCIAL ASSESSMENT - MATERNAL/CHILD  09/15/2013  Patient: Nicole Fry,Nicole Fry Account Number: [id number] Admit Date: 09/14/2013  Childs Name:  Nicole Fry   Clinical Social Worker: Vita Currin, LCSW Date/Time: 09/15/2013 07:13 PM  Date Referred: 09/15/2013  Referral source   CN    Referred reason   Behavioral Health Issues   Other referral source:  I: FAMILY / HOME ENVIRONMENT  Child's legal guardian: PARENT  Guardian - Name  Guardian - Age  Guardian - Address   [place]  27  [address] , [place] [address]   Christopher Chestnut  21    Other household support members/support persons  Name  Relationship  DOB   Claudine Livingston  FRIEND  28 years old    SON  2006    SON  2010    OTHER    Other support:  II PSYCHOSOCIAL DATA  Information Source: Patient Interview  Financial and Community Resources  Employment:  Financial resources: Medicaid  If Medicaid - County: [place]  Other   Food Stamps   School / Grade:  Maternity Care Coordinator / Child Services Coordination / Early Interventions: Cultural issues impacting care:  III STRENGTHS  Strengths   Adequate Resources   Home prepared for Child (including basic supplies)   Supportive family/friends   Strength comment:  IV RISK FACTORS AND CURRENT PROBLEMS  Current Problem: YES  Risk Factor & Current Problem  Patient Issue  Family Issue  Risk Factor / Current Problem Comment   Mental Illness  Y  N  Hx of PP depression   V SOCIAL WORK ASSESSMENT  CSW met with pt to assess history of PP depression & currently living situation. The pt is originally from [place]. She relocated to the area, 1 year ago with her boyfriend (at the time) & 2 young children. She is living with a friend & her children in a 2 bedroom house. The pt & her children share a bedroom, where the siblings sleep together. She is employed but receives $721 monthly for SSI benefits. Her oldest son is autistic.  The pt is the only adult in the home paying bills. She has purchased the majority of the infant supplies but expressed a need for a crib or bassinet. Pt told CSW that she does not want to sleep in the bed with the baby because she rolled on one of her other children while sleeping. The baby did not pass away but she does not want to chance it happening again & appropriately concerned. CSW praised pt for her insight & encouraged her to go to area consignment shops. Pt seems overwhelmed about her living situation, as she said it "overcrowded." CSW provided pt with application to [employer] [employer], [employer] & information on [facility]. Pt thanked CSW & seemed appreciative. CSW will fax completed applications prior to discharge. Pt has history of PP depression & concerned that her living situation may increase risk. While in [place], pt was court ordered to seek psychiatric services, after she "lost it." She was never hospitalized but could not see her children for 2 months. Pt told CSW that she complied with services & was able to regain custody. Pt is not interested in medication or counseling referrals at this time. Due to pt's history, she is at high risk of experiencing PP depression. CSW will refer to [facility]. Pt seems receptive to any resources that will improve her quality of life. FOB is involved & supportive, per   pt. CSW observed pt bonding & appears to be appropriate at this time. CSW available to assist further if needed.   VI SOCIAL WORK PLAN  Social Work Plan   No Further Intervention Required / No Barriers to Discharge   Information/Referral to Community Resources   Type of pt/family education:  If child protective services report - county:  If child protective services report - date:  Information/referral to community resources comment:  Other social work plan:     

## 2013-09-15 NOTE — Anesthesia Postprocedure Evaluation (Signed)
  Anesthesia Post-op Note  Anesthesia Post Note  Patient: Nicole Fry  Procedure(s) Performed: * No procedures listed *  Anesthesia type: Epidural  Patient location: Mother/Baby  Post pain: Pain level controlled  Post assessment: Post-op Vital signs reviewed  Last Vitals:  Filed Vitals:   09/15/13 0400  BP: 101/57  Pulse: 82  Temp: 36.3 C  Resp: 18    Post vital signs: Reviewed  Level of consciousness:alert  Complications: No apparent anesthesia complications

## 2013-09-15 NOTE — Progress Notes (Signed)
Patient ID: Nicole Fry, female   DOB: May 10, 1985, 28 y.o.   MRN: [id number] Postpartum day one Vital signs normal Fundus firm Lochia moderate Legs negative Doing well and and

## 2013-09-15 NOTE — Progress Notes (Signed)
UR chart review completed.  

## 2013-09-15 NOTE — H&P (Signed)
This is Dr. Francoise Ceo dictating the history and physical ona him in in in in in a you and and and andkath in a youleen c an in andarr and and you and you and you and he is in an in and he is in a you and and and and and and and andain aandnd and and and and and and and and and aand and and and aand and and and and and and andand and and she's a 28 year old gravida 6 para 58 old 31 positive GBS treated with penicillin she is a 40 weeks and 4 days and admitted in labor 5 PM she was 6 cm 90% and 0 station and she progressed rapidly and had a normal vaginal delivery Apgar 9 and 9 placenta spontaneous no episiotomy or laceration Past medical history negative Past surgical history negative Social history negative System review negative Physical exam well-developed female in no distress HEENT negative Lungs clear to P&A Heart regular rhythm no murmurs no gallops Breasts negative Abdomen 20 week size uterus postpartum Extremities negative and and and and and

## 2013-09-16 MED ORDER — PNEUMOCOCCAL VAC POLYVALENT 25 MCG/0.5ML IJ INJ
0.5000 mL | INJECTION | Freq: Once | INTRAMUSCULAR | Status: AC
  Administered 2013-09-16: 0.5 mL via INTRAMUSCULAR
  Filled 2013-09-16: qty 0.5

## 2013-09-16 MED ORDER — PNEUMOCOCCAL VAC POLYVALENT 25 MCG/0.5ML IJ INJ: 0.5000 mL | INJECTION | INTRAMUSCULAR | Status: DC

## 2013-09-16 NOTE — Discharge Summary (Signed)
Obstetric Discharge Summary Reason for Admission: onset of labor Prenatal Procedures: none Intrapartum Procedures: spontaneous vaginal delivery Postpartum Procedures: none Complications-Operative and Postpartum: none Hemoglobin  Date Value Range Status  09/15/2013 8.9* 12.0 - 15.0 g/dL Final     DELTA CHECK NOTED     REPEATED TO VERIFY     HCT  Date Value Range Status  09/15/2013 26.4* 36.0 - 46.0 % Final    Physical Exam:  General: alert Lochia: appropriate Uterine Fundus: firm Incision: healing well DVT Evaluation: No evidence of DVT seen on physical exam.  Discharge Diagnoses: Term Pregnancy-delivered  Discharge Information: Date: 09/16/2013 Activity: pelvic rest Diet: routine Medications: Percocet Condition: stable Instructions: refer to practice specific booklet Discharge to: home Follow-up Information   Follow up with Kathreen Cosier, MD.   Specialty:  Obstetrics and Gynecology   Contact information:   [address] [place] [address] [phone]       Newborn Data: Live born female  Birth Weight: 6 lb 5.2 oz (2870 g) APGAR: 9, 9  Home with mother.  Davin Archuletta A 09/16/2013, 6:47 AM

## 2014-10-25 LAB — PROCEDURE REPORT - SCANNED: Pap: NEGATIVE

## 2014-11-01 ENCOUNTER — Encounter (HOSPITAL_COMMUNITY): Payer: Self-pay

## 2016-04-04 ENCOUNTER — Encounter (HOSPITAL_COMMUNITY): Payer: Self-pay | Admitting: Family Medicine

## 2016-04-04 ENCOUNTER — Emergency Department (HOSPITAL_COMMUNITY)
Admission: EM | Admit: 2016-04-04 | Discharge: 2016-04-05 | Disposition: A | Discharge status: Home / Self Care | Payer: Medicaid Other | Attending: Emergency Medicine | Admitting: Emergency Medicine

## 2016-04-04 DIAGNOSIS — Z8659 Personal history of other mental and behavioral disorders: Secondary | ICD-10-CM | POA: Insufficient documentation

## 2016-04-04 DIAGNOSIS — Z862 Personal history of diseases of the blood and blood-forming organs and certain disorders involving the immune mechanism: Secondary | ICD-10-CM | POA: Insufficient documentation

## 2016-04-04 DIAGNOSIS — J45909 Unspecified asthma, uncomplicated: Secondary | ICD-10-CM | POA: Insufficient documentation

## 2016-04-04 DIAGNOSIS — I889 Nonspecific lymphadenitis, unspecified: Secondary | ICD-10-CM

## 2016-04-04 DIAGNOSIS — K120 Recurrent oral aphthae: Secondary | ICD-10-CM | POA: Insufficient documentation

## 2016-04-04 DIAGNOSIS — Z87891 Personal history of nicotine dependence: Secondary | ICD-10-CM | POA: Insufficient documentation

## 2016-04-04 NOTE — ED Notes (Signed)
Pt here for left lateral neck swelling, and pain radiating into ear. Obvious swelling.

## 2016-04-04 NOTE — ED Provider Notes (Signed)
CSN: [id number]     Arrival date & time 04/04/16  1721 History  By signing my name below, I, Freida Busmaniana Omoyeni, attest that this documentation has been prepared under the direction and in the presence of Rolland PorterMark Maggy Wyble, MD . Electronically Signed: Freida Busmaniana Omoyeni, Scribe. 04/04/2016. 11:59 PM.    Chief Complaint  Patient presents with  . Lymphadenopathy  . Otalgia    The history is provided by the patient. No language interpreter was used.    HPI Comments:  Reginia NaasKathleen Stark is a 31 y.o. female who presents to the Emergency Department complaining of gradually worsening, swelling to the left neck x 2 weeks . She reports associated left sided ear pain x 5 days. Pt denies cough, fever, and pain to her scalp. Pt also denies sore throat but notes left sided neck pain when swallowing. She has been applying pressure to the site without relief.   Past Medical History  Diagnosis Date  . Asthma   . Headache(784.0)   . Anemia   . Hypertension   . Depression    Past Surgical History  Procedure Laterality Date  . Dilation and curettage of uterus    . Breast biopsy  06/03/2013   Family History  Problem Relation Age of Onset  . Hypertension Mother   . Hypertension Maternal Grandmother    Social History  Substance Use Topics  . Smoking status: Former Games developermoker  . Smokeless tobacco: Never Used  . Alcohol Use: Yes   OB History    Gravida Para Term Preterm AB TAB SAB Ectopic Multiple Living   6 3 3  3 2 1   3      Review of Systems  Constitutional: Negative for fever, chills, diaphoresis, appetite change and fatigue.  HENT: Positive for ear pain. Negative for mouth sores, sore throat and trouble swallowing.   Eyes: Negative for visual disturbance.  Respiratory: Negative for cough, chest tightness, shortness of breath and wheezing.   Cardiovascular: Negative for chest pain.  Gastrointestinal: Negative for nausea, vomiting, abdominal pain, diarrhea and abdominal distention.  Endocrine: Negative for  polydipsia, polyphagia and polyuria.  Genitourinary: Negative for dysuria, frequency and hematuria.  Musculoskeletal: Positive for neck pain (and swelling). Negative for gait problem.  Skin: Negative for color change, pallor and rash.  Neurological: Negative for dizziness, syncope, light-headedness and headaches.  Hematological: Does not bruise/bleed easily.  Psychiatric/Behavioral: Negative for behavioral problems and confusion.  All other systems reviewed and are negative.  Allergies  Shellfish allergy  Home Medications   Prior to Admission medications   Medication Sig Start Date End Date Taking? Authorizing Provider  cephALEXin (KEFLEX) 500 MG capsule Take 1 capsule (500 mg total) by mouth 3 (three) times daily. 04/05/16   Rolland PorterMark Dejean Tribby, MD   BP 112/70 mmHg  Pulse 87  Temp(Src) 98.2 F (36.8 C) (Oral)  Resp 16  Ht 5\' 3"  (1.6 m)  Wt 110 lb (49.896 kg)  BMI 19.49 kg/m2  SpO2 100%  LMP 04/04/2016 Physical Exam  Constitutional: She is oriented to person, place, and time. She appears well-developed and well-nourished. No distress.  HENT:  Head: Normocephalic.  Right Ear: Tympanic membrane normal.  Left Ear: Tympanic membrane normal.  2 small yellow ulcers to left tonsil   Eyes: Conjunctivae are normal. Pupils are equal, round, and reactive to light. No scleral icterus.  Neck: Normal range of motion. Neck supple. No thyromegaly present.  Cardiovascular: Normal rate and regular rhythm.  Exam reveals no gallop and no friction rub.  No murmur heard. Pulmonary/Chest: Effort normal and breath sounds normal. No respiratory distress. She has no wheezes. She has no rales.  Abdominal: Soft. Bowel sounds are normal. She exhibits no distension. There is no tenderness. There is no rebound.  Musculoskeletal: Normal range of motion.  Lymphadenopathy:       Head (left side): Submandibular adenopathy present.  3 x 2 cm submandibular lymph node on the left  Neurological: She is alert and  oriented to person, place, and time.  Skin: Skin is warm and dry. No rash noted.  Psychiatric: She has a normal mood and affect. Her behavior is normal.    ED Course  Procedures  DIAGNOSTIC STUDIES:  Oxygen Saturation is 100% on RA, normal by my interpretation.    COORDINATION OF CARE:  11:52 PM Discussed treatment plan with pt at bedside and pt agreed to plan.   MDM   Final diagnoses:  Aphthous stomatitis  Cervical adenitis        Rolland Porter, MD 04/05/16 0004

## 2016-04-05 MED ORDER — CEPHALEXIN 250 MG PO CAPS
500.0000 mg | ORAL_CAPSULE | Freq: Once | ORAL | Status: AC
  Administered 2016-04-05: 500 mg via ORAL
  Filled 2016-04-05: qty 2

## 2016-04-05 MED ORDER — CEPHALEXIN 500 MG PO CAPS: 500.0000 mg | ORAL_CAPSULE | Freq: Three times a day (TID) | ORAL | Status: DC

## 2016-04-05 NOTE — Discharge Instructions (Signed)
Stomatitis Stomatitis is a condition that causes inflammation in your mouth. It can affect a part of your mouth or your whole mouth. The condition often affects your cheek, teeth, gums, lips, and tongue. Stomatitis can also affect the mucous membranes that surround your mouth (mucosa). Pain from stomatitis can make it hard for you to eat or drink. Severe cases of this condition can lead to dehydration or poor nutrition. CAUSES Common causes of this condition include:  Viruses, such as cold sores or oral herpes and shingles.  Canker sores.  Bacterial infections.  Fungus or yeast infections, such as oral thrush.  Not getting adequate nutrition.  Injury to your mouth. This can be from:  Dentures or braces that do not fit well.  Biting your tongue or cheek.  Burning your mouth.  Having sharp or broken teeth.  Gum disease.  Using tobacco, especially chewing tobacco.  Allergies to foods, medicines, or substances that are used in your mouth.  Medicines, including cancer medicines (chemotherapy), antihistamines, and seizure medicines. In some cases, the cause may not be known. RISK FACTORS This condition is more likely to develop in people who:  Have poor oral hygiene or poor nutrition.  Have any condition that causes a dry mouth.  Are under a lot of physical or emotional stress.  Have any condition that weakens the body's defense system (immune system).  Are being treated for cancer.  Smoke. SYMPTOMS The most common symptoms of this condition are pain, swelling, and redness inside your mouth. The pain may feel like burning or stinging. It may get worse from eating or drinking. Other symptoms include:  Painful, shallow sores (ulcers) in the mouth.  Blisters in the mouth.  Bleeding gums.  Swollen gums.  Irritability and fatigue.  Bad breath.  Bad taste in the mouth.  Fever. DIAGNOSIS This condition is diagnosed with a physical exam to check for bleeding gums  and mouth ulcers. You may also have other tests, including:  Blood tests to look for infection or vitamin deficiencies.  Mouth swab to get a fluid sample to test for bacteria (culture).  Tissue sample from an ulcer to examine under a microscope (biopsy). TREATMENT Treatment for stomatitis depends on the cause. Treatment may include medicines, such as:  Over-the counter (OTC) pain medicines.  Topical anesthetic to numb the area if you have severe pain.  Antibiotics to treat a bacterial infection.  Antifungals to treat a fungal infection.  Antivirals to treat a viral infection.  Mouth rinses that contain steroids to reduce the swelling in your mouth.  Other medicines to coat or numb your mouth. HOME CARE INSTRUCTIONS Medicines  Take medicines only as directed by your health care provider.  If you were prescribed an antibiotic, finish all of it even if you start to feel better. Lifestyle  Practice good oral hygiene:  Gently brush your teeth with a soft, nylon-bristled toothbrush two times each day.  Floss your teeth every day.  Have your teeth cleaned regularly, as recommended by your dentist.  Eat a balanced diet.Do not eat:  Spicy foods.  Citrus, such as oranges.  Foods that have sharp edges, such as chips.  Avoid any foods or other allergens that you think may be causing your stomatitis.  If you have dentures, make sure that they are properly fitted.  Do not use any tobacco products, including cigarettes, chewing tobacco, or electronic cigarettes. If you need help quitting, ask your health care provider.  Find ways to reduce stress. Try yoga  or meditation. Ask your health care provider for other ideas. General Instructions  Use a salt-water rinse for pain as directed by your health care provider. Mix 1 tsp of salt in 2 cups of water.  Drink enough fluid to keep your urine clear or pale yellow. This will keep you hydrated. SEEK MEDICAL CARE IF:  Your  symptoms get worse.  You develop new symptoms, especially:  A rash.  New symptoms that do not involve your mouth area.  Your symptoms last longer than three weeks.  Your stomatitis goes away and then returns.  You have a harder time eating and drinking normally.  You have increasing fatigue or weakness.  You lose your appetite or you feel nauseous.  You have a fever.   This information is not intended to replace advice given to you by your health care provider. Make sure you discuss any questions you have with your health care provider.   Document Released: 10/14/2007 Document Revised: 05/03/2015 Document Reviewed: 12/13/2014 Elsevier Interactive Patient Education 2016 Elsevier Inc.  Lymphadenopathy Lymphadenopathy refers to swollen or enlarged lymph glands, also called lymph nodes. Lymph glands are part of your body's defense (immune) system, which protects the body from infections, germs, and diseases. Lymph glands are found in many locations in your body, including the neck, underarm, and groin.  Many things can cause lymph glands to become enlarged. When your immune system responds to germs, such as viruses or bacteria, infection-fighting cells and fluid build up. This causes the glands to grow in size. Usually, this is not something to worry about. The swelling and any soreness often go away without treatment. However, swollen lymph glands can also be caused by a number of diseases. Your health care provider may do various tests to help determine the cause. If the cause of your swollen lymph glands cannot be found, it is important to monitor your condition to make sure the swelling goes away. HOME CARE INSTRUCTIONS Watch your condition for any changes. The following actions may help to lessen any discomfort you are feeling:  Get plenty of rest.  Take medicines only as directed by your health care provider. Your health care provider may recommend over-the-counter medicines for  pain.  Apply moist heat compresses to the site of swollen lymph nodes as directed by your health care provider. This can help reduce any pain.  Check your lymph nodes daily for any changes.  Keep all follow-up visits as directed by your health care provider. This is important. SEEK MEDICAL CARE IF:  Your lymph nodes are still swollen after 2 weeks.  Your swelling increases or spreads to other areas.  Your lymph nodes are hard, seem fixed to the skin, or are growing rapidly.  Your skin over the lymph nodes is red and inflamed.  You have a fever.  You have chills.  You have fatigue.  You develop a sore throat.  You have abdominal pain.  You have weight loss.  You have night sweats. SEEK IMMEDIATE MEDICAL CARE IF:  You notice fluid leaking from the area of the enlarged lymph node.  You have severe pain in any area of your body.  You have chest pain.  You have shortness of breath.   This information is not intended to replace advice given to you by your health care provider. Make sure you discuss any questions you have with your health care provider.   Document Released: 09/25/2008 Document Revised: 01/07/2015 Document Reviewed: 07/22/2014 Elsevier Interactive Patient Education 2016 Elsevier  Inc. ° °

## 2016-09-26 ENCOUNTER — Encounter: Payer: Self-pay | Admitting: *Deleted

## 2018-05-27 ENCOUNTER — Ambulatory Visit (HOSPITAL_COMMUNITY)
Admission: EM | Admit: 2018-05-27 | Discharge: 2018-05-27 | Disposition: A | Discharge status: Home / Self Care | Payer: Self-pay | Attending: Family Medicine | Admitting: Family Medicine

## 2018-05-27 ENCOUNTER — Encounter (HOSPITAL_COMMUNITY): Payer: Self-pay | Admitting: Emergency Medicine

## 2018-05-27 DIAGNOSIS — K0889 Other specified disorders of teeth and supporting structures: Secondary | ICD-10-CM

## 2018-05-27 MED ORDER — KETOROLAC TROMETHAMINE 30 MG/ML IJ SOLN
INTRAMUSCULAR | Status: AC
  Filled 2018-05-27: qty 1

## 2018-05-27 MED ORDER — PENICILLIN V POTASSIUM 500 MG PO TABS: 500.0000 mg | ORAL_TABLET | Freq: Four times a day (QID) | ORAL | 1 refills | Status: AC

## 2018-05-27 MED ORDER — KETOROLAC TROMETHAMINE 30 MG/ML IJ SOLN
30.0000 mg | Freq: Once | INTRAMUSCULAR | Status: AC
  Administered 2018-05-27: 30 mg via INTRAMUSCULAR

## 2018-05-27 NOTE — ED Triage Notes (Signed)
Pt sts left sided dental pain 

## 2018-05-27 NOTE — ED Provider Notes (Signed)
[facility]    CSN: [id number] Arrival date & time: 05/27/18  1635     History   Chief Complaint Chief Complaint  Patient presents with  . Dental Pain    HPI Nicole Fry is a 33 y.o. female.  3-week history of left lower dental pain.  Patient has tried to see several dentist but she does not apparently have Medicaid coverage her dentist 3.  Today reports increasing pain and swelling.   HPI  Past Medical History:  Diagnosis Date  . Anemia   . Asthma   . Depression   . Headache(784.0)   . Hypertension     There are no active problems to display for this patient.   Past Surgical History:  Procedure Laterality Date  . BREAST BIOPSY  06/03/2013  . DILATION AND CURETTAGE OF UTERUS      OB History    Gravida  6   Para  3   Term  3   Preterm      AB  3   Living  3     SAB  1   TAB  2   Ectopic      Multiple      Live Births  3            Home Medications    Prior to Admission medications   Medication Sig Start Date End Date Taking? Authorizing Provider  cephALEXin (KEFLEX) 500 MG capsule Take 1 capsule (500 mg total) by mouth 3 (three) times daily. 04/05/16   Rolland Porter, MD    Family History Family History  Problem Relation Age of Onset  . Hypertension Mother   . Hypertension Maternal Grandmother     Social History Social History   Tobacco Use  . Smoking status: Former Games developer  . Smokeless tobacco: Never Used  Substance Use Topics  . Alcohol use: Yes  . Drug use: No     Allergies   Shellfish allergy   Review of Systems Review of Systems  Constitutional: Negative.   HENT: Positive for dental problem.   Respiratory: Negative.   Cardiovascular: Negative.   Gastrointestinal: Negative.      Physical Exam Triage Vital Signs ED Triage Vitals [05/27/18 1725]  Enc Vitals Group     BP 107/73     Pulse Rate 80     Resp 18     Temp 98.7 F (37.1 C)     Temp Source Oral     SpO2 100 %     Weight      Height        Head Circumference      Peak Flow      Pain Score      Pain Loc      Pain Edu?      Excl. in GC?    No data found.  Updated Vital Signs BP 107/73 (BP Location: Left Arm)   Pulse 80   Temp 98.7 F (37.1 C) (Oral)   Resp 18   SpO2 100%   Visual Acuity Right Eye Distance:   Left Eye Distance:   Bilateral Distance:    Right Eye Near:   Left Eye Near:    Bilateral Near:     Physical Exam  Constitutional: She is oriented to person, place, and time. She appears well-developed and well-nourished.  HENT:  Left lower second molar appears to have a hole in the tooth.  There is some swelling on the lateral aspect and it  is tender to percussion with tongue blade.  Neurological: She is alert and oriented to person, place, and time.     UC Treatments / Results  Labs (all labs ordered are listed, but only abnormal results are displayed) Labs Reviewed - No data to display  EKG None  Radiology No results found.  Procedures Procedures (including critical care time)  Medications Ordered in UC Medications  ketorolac (TORADOL) 30 MG/ML injection 30 mg (has no administration in time range)    Initial Impression / Assessment and Plan / UC Course  I have reviewed the triage vital signs and the nursing notes.  Pertinent labs & imaging results that were available during my care of the patient were reviewed by me and considered in my medical decision making (see chart for details).     Dental pain.  Will treat with Pen-Vee K and hydrocodone.  Encouraged follow-up with dentist if possible Final Clinical Impressions(s) / UC Diagnoses   Final diagnoses:  None   Discharge Instructions   None    ED Prescriptions    None     Controlled Substance Prescriptions [place] Controlled Substance Registry consulted? Yes, I have consulted the [place] Controlled Substances Registry for this patient, and feel the risk/benefit ratio today is favorable for proceeding with this prescription for  a controlled substance.   Frederica Kuster, MD 05/27/18 [phone]

## 2018-06-11 ENCOUNTER — Encounter (HOSPITAL_COMMUNITY): Payer: Self-pay | Admitting: Emergency Medicine

## 2018-06-11 ENCOUNTER — Ambulatory Visit (HOSPITAL_COMMUNITY)
Admission: EM | Admit: 2018-06-11 | Discharge: 2018-06-11 | Disposition: A | Discharge status: Home / Self Care | Payer: Medicaid Other | Attending: Family Medicine | Admitting: Family Medicine

## 2018-06-11 ENCOUNTER — Other Ambulatory Visit: Payer: Self-pay

## 2018-06-11 DIAGNOSIS — L739 Follicular disorder, unspecified: Secondary | ICD-10-CM

## 2018-06-11 MED ORDER — SULFAMETHOXAZOLE-TRIMETHOPRIM 800-160 MG PO TABS
1.0000 | ORAL_TABLET | Freq: Two times a day (BID) | ORAL | 0 refills | Status: AC
Start: 2018-06-11 — End: 2018-06-21

## 2018-06-11 MED ORDER — PREDNISONE 10 MG (21) PO TBPK: ORAL_TABLET | Freq: Every day | ORAL | 0 refills | Status: DC

## 2018-06-11 NOTE — ED Provider Notes (Signed)
[facility]   [id number] 06/11/18 Arrival Time: 1710  ASSESSMENT & PLAN:  1. Acute folliculitis   Discussed that I doubt this is related to PCN use 2 weeks ago. Will start: Meds ordered this encounter  Medications  . sulfamethoxazole-trimethoprim (BACTRIM DS,SEPTRA DS) 800-160 MG tablet    Sig: Take 1 tablet by mouth 2 (two) times daily for 10 days.    Dispense:  20 tablet    Refill:  0  . predniSONE (STERAPRED UNI-PAK 21 TAB) 10 MG (21) TBPK tablet    Sig: Take by mouth daily. Take as directed.    Dispense:  21 tablet    Refill:  0    Follow-up Information    [facility].   Specialty:  Urgent Care Why:  If not seeing improvement over the next few days. Contact information: [address] [place] [phone]         Recommend recheck visit to check BP again.  Reviewed expectations re: course of current medical issues. Questions answered. Outlined signs and symptoms indicating need for more acute intervention. Patient verbalized understanding. After Visit Summary given.   SUBJECTIVE:  Nicole Fry is a 33 y.o. female who presents with a skin complaint.   Location: LE bilat Onset: gradual Duration: a few days Pruritic? mildly Painful? No Progression: stable  Drainage? No  Known trigger? No; she questions relation to PCN taken approx 2 weeks ago New soaps/lotions/topicals/detergents? No Environmental exposures or allergies? none Contacts with similar? No Recent travel? No  Other associated symptoms: none Therapies tried thus far: none Denies fever. No specific aggravating or alleviating factors reported.  ROS: As per HPI.  OBJECTIVE: Vitals:   06/11/18 1746  BP: (!) 128/106  Pulse: 86  Temp: 98.7 F (37.1 C)  TempSrc: Oral  SpO2: 100%    General appearance: alert; no distress Lungs: clear to auscultation bilaterally Heart: regular rate and rhythm Extremities: no  edema Skin: warm and dry; folliculitis of LE; no cellulitis Psychological: alert and cooperative; normal mood and affect  Allergies  Allergen Reactions  . Shellfish Allergy Anaphylaxis    Past Medical History:  Diagnosis Date  . Anemia   . Asthma   . Depression   . Headache(784.0)   . Hypertension    Social History   Socioeconomic History  . Marital status: Single    Spouse name: Not on file  . Number of children: Not on file  . Years of education: Not on file  . Highest education level: Not on file  Occupational History  . Not on file  Social Needs  . Financial resource strain: Not on file  . Food insecurity:    Worry: Not on file    Inability: Not on file  . Transportation needs:    Medical: Not on file    Non-medical: Not on file  Tobacco Use  . Smoking status: Former Games developermoker  . Smokeless tobacco: Never Used  Substance and Sexual Activity  . Alcohol use: Yes  . Drug use: No  . Sexual activity: Not on file  Lifestyle  . Physical activity:    Days per week: Not on file    Minutes per session: Not on file  . Stress: Not on file  Relationships  . Social connections:    Talks on phone: Not on file    Gets together: Not on file    Attends religious service: Not on file    Active member of  club or organization: Not on file    Attends meetings of clubs or organizations: Not on file    Relationship status: Not on file  . Intimate partner violence:    Fear of current or ex partner: Not on file    Emotionally abused: Not on file    Physically abused: Not on file    Forced sexual activity: Not on file  Other Topics Concern  . Not on file  Social History Narrative  . Not on file   Family History  Problem Relation Age of Onset  . Hypertension Mother   . Hypertension Maternal Grandmother    Past Surgical History:  Procedure Laterality Date  . BREAST BIOPSY  06/03/2013  . DILATION AND CURETTAGE OF UTERUS       Mardella Layman, MD 06/30/18 1024

## 2018-06-11 NOTE — ED Notes (Signed)
Pt called to triage x1.  No answer. 

## 2018-06-11 NOTE — ED Triage Notes (Signed)
Pt states she broke out in white blisters all over her body after taking penicillin 2 weeks ago.

## 2018-06-16 ENCOUNTER — Other Ambulatory Visit: Payer: Self-pay

## 2018-06-16 ENCOUNTER — Encounter (HOSPITAL_COMMUNITY): Payer: Self-pay | Admitting: Pharmacy Technician

## 2018-06-16 ENCOUNTER — Emergency Department (HOSPITAL_COMMUNITY)
Admission: EM | Admit: 2018-06-16 | Discharge: 2018-06-16 | Disposition: A | Discharge status: Home / Self Care | Payer: Medicaid Other | Attending: Emergency Medicine | Admitting: Emergency Medicine

## 2018-06-16 DIAGNOSIS — I10 Essential (primary) hypertension: Secondary | ICD-10-CM | POA: Insufficient documentation

## 2018-06-16 DIAGNOSIS — L739 Follicular disorder, unspecified: Secondary | ICD-10-CM | POA: Insufficient documentation

## 2018-06-16 DIAGNOSIS — Z87891 Personal history of nicotine dependence: Secondary | ICD-10-CM | POA: Insufficient documentation

## 2018-06-16 DIAGNOSIS — J45909 Unspecified asthma, uncomplicated: Secondary | ICD-10-CM | POA: Insufficient documentation

## 2018-06-16 LAB — CBC WITH DIFFERENTIAL/PLATELET
Abs Immature Granulocytes: 0 10*3/uL (ref 0.0–0.1)
Basophils Absolute: 0.1 10*3/uL (ref 0.0–0.1)
Basophils Relative: 1 %
EOS ABS: 0.2 10*3/uL (ref 0.0–0.7)
Eosinophils Relative: 3 %
HEMATOCRIT: 35.7 % — AB (ref 36.0–46.0)
Hemoglobin: 11.6 g/dL — ABNORMAL LOW (ref 12.0–15.0)
IMMATURE GRANULOCYTES: 0 %
LYMPHS ABS: 1.4 10*3/uL (ref 0.7–4.0)
LYMPHS PCT: 21 %
MCH: 33 pg (ref 26.0–34.0)
MCHC: 32.5 g/dL (ref 30.0–36.0)
MCV: 101.7 fL — ABNORMAL HIGH (ref 78.0–100.0)
Monocytes Absolute: 0.7 10*3/uL (ref 0.1–1.0)
Monocytes Relative: 11 %
NEUTROS PCT: 64 %
Neutro Abs: 4.3 10*3/uL (ref 1.7–7.7)
Platelets: 252 10*3/uL (ref 150–400)
RBC: 3.51 MIL/uL — AB (ref 3.87–5.11)
RDW: 13.9 % (ref 11.5–15.5)
WBC: 6.7 10*3/uL (ref 4.0–10.5)

## 2018-06-16 LAB — BASIC METABOLIC PANEL
Anion gap: 8 (ref 5–15)
BUN: 8 mg/dL (ref 6–20)
CHLORIDE: 109 mmol/L (ref 101–111)
CO2: 22 mmol/L (ref 22–32)
CREATININE: 0.76 mg/dL (ref 0.44–1.00)
Calcium: 8.9 mg/dL (ref 8.9–10.3)
GFR calc non Af Amer: >60 mL/min (ref >60)
Glucose, Bld: 82 mg/dL (ref 65–99)
POTASSIUM: 3.7 mmol/L (ref 3.5–5.1)
SODIUM: 139 mmol/L (ref 135–145)

## 2018-06-16 LAB — I-STAT BETA HCG BLOOD, ED (MC, WL, AP ONLY): I-stat hCG, quantitative: <5 m[IU]/mL (ref <5)

## 2018-06-16 MED ORDER — BACITRACIN ZINC 500 UNIT/GM EX OINT: 1.0000 "application " | TOPICAL_OINTMENT | Freq: Two times a day (BID) | CUTANEOUS | 0 refills | Status: DC

## 2018-06-16 MED ORDER — SULFAMETHOXAZOLE-TRIMETHOPRIM 800-160 MG PO TABS
1.0000 | ORAL_TABLET | Freq: Once | ORAL | Status: AC
  Administered 2018-06-16: 1 via ORAL
  Filled 2018-06-16: qty 1

## 2018-06-16 MED ORDER — SULFAMETHOXAZOLE-TRIMETHOPRIM 800-160 MG PO TABS
1.0000 | ORAL_TABLET | Freq: Two times a day (BID) | ORAL | 0 refills | Status: AC
Start: 2018-06-16 — End: 2018-06-23

## 2018-06-16 NOTE — ED Triage Notes (Signed)
Pt arrives via EMS from home with blisters to whole body. Pt reports happened 2 weeks ago after starting penicillin RX for tootheache. Pt in NAD. 105/68, P90, RR 16, 100% RA.

## 2018-06-16 NOTE — ED Notes (Signed)
Pt stable, ambulatory, states understanding of discharge instructions 

## 2018-06-16 NOTE — Discharge Instructions (Addendum)
Please return for any problem.  Please take prescribed a course of Bactrim as instructed.

## 2018-06-16 NOTE — ED Provider Notes (Signed)
[facility] EMERGENCY DEPARTMENT Provider Note   CSN: [id number] Arrival date & time: 06/16/18  1622     History   Chief Complaint Chief Complaint  Patient presents with  . Rash    HPI Nicole Fry is a 33 y.o. female.  33 year old female with prior history significant for eczema presents with complaints of rash.  Patient reports itchy painful rash to both lower extremities and both upper extremities.  Patiently seen at an urgent care for same complaint and diagnosed with folliculitis.  She was given prescriptions for voids and Bactrim.  She has failed to get the prescriptions filled secondary to not having enough money to do so.  She denies fever.  She presents requesting assistance with prescriptions.  She also wants to make sure that she has folliculitis.    The history is provided by the patient and medical records.  Rash   This is a new problem. The current episode started more than 1 week ago. The problem has not changed since onset.The problem is associated with nothing. There has been no fever. The rash is present on the left lower leg, right lower leg, left arm and right arm. The pain is mild. The pain has been constant since onset. Associated symptoms include itching and pain. She has tried nothing for the symptoms.    Past Medical History:  Diagnosis Date  . Anemia   . Asthma   . Depression   . Headache(784.0)   . Hypertension     There are no active problems to display for this patient.   Past Surgical History:  Procedure Laterality Date  . BREAST BIOPSY  06/03/2013  . DILATION AND CURETTAGE OF UTERUS       OB History    Gravida  6   Para  3   Term  3   Preterm      AB  3   Living  3     SAB  1   TAB  2   Ectopic      Multiple      Live Births  3            Home Medications    Prior to Admission medications   Medication Sig Start Date End Date Taking? Authorizing Provider  predniSONE (STERAPRED UNI-PAK 21  TAB) 10 MG (21) TBPK tablet Take by mouth daily. Take as directed. 06/11/18   Mardella Layman, MD  sulfamethoxazole-trimethoprim (BACTRIM DS,SEPTRA DS) 800-160 MG tablet Take 1 tablet by mouth 2 (two) times daily for 10 days. 06/11/18 06/21/18  Mardella Layman, MD    Family History Family History  Problem Relation Age of Onset  . Hypertension Mother   . Hypertension Maternal Grandmother     Social History Social History   Tobacco Use  . Smoking status: Former Games developer  . Smokeless tobacco: Never Used  Substance Use Topics  . Alcohol use: Yes  . Drug use: No     Allergies   Shellfish allergy   Review of Systems Review of Systems  Skin: Positive for itching and rash.  All other systems reviewed and are negative.    Physical Exam Updated Vital Signs BP 111/78   Pulse 87   Temp 98.2 F (36.8 C) (Oral)   Resp 16   SpO2 100%   Physical Exam  Constitutional: She is oriented to person, place, and time. She appears well-developed and well-nourished. No distress.  HENT:  Head: Normocephalic and atraumatic.  Mouth/Throat: Oropharynx is clear  and moist.  Eyes: Pupils are equal, round, and reactive to light. Conjunctivae and EOM are normal.  Neck: Normal range of motion. Neck supple.  Cardiovascular: Normal rate, regular rhythm and normal heart sounds.  Pulmonary/Chest: Effort normal and breath sounds normal. No respiratory distress.  Abdominal: Soft. She exhibits no distension. There is no tenderness.  Musculoskeletal: Normal range of motion. She exhibits no edema or deformity.  Neurological: She is alert and oriented to person, place, and time.  Skin: Skin is warm and dry.  Rash diffusely present to the extremities -both upper and both lower-consistent with folliculitis.   No evidence of abscess or cellulitis.  Psychiatric: She has a normal mood and affect.  Nursing note and vitals reviewed.    ED Treatments / Results  Labs (all labs ordered are listed, but only abnormal  results are displayed) Labs Reviewed  CBC WITH DIFFERENTIAL/PLATELET - Abnormal; Notable for the following components:      Result Value   RBC 3.51 (*)    Hemoglobin 11.6 (*)    HCT 35.7 (*)    MCV 101.7 (*)    All other components within normal limits  BASIC METABOLIC PANEL  I-STAT BETA HCG BLOOD, ED ([facility], [facility], [facility] ONLY)    EKG None  Radiology No results found.  Procedures Procedures (including critical care time)  Medications Ordered in ED Medications  sulfamethoxazole-trimethoprim (BACTRIM DS,SEPTRA DS) 800-160 MG per tablet 1 tablet (1 tablet Oral Given 06/16/18 1809)     Initial Impression / Assessment and Plan / ED Course  I have reviewed the triage vital signs and the nursing notes.  Pertinent labs & imaging results that were available during my care of the patient were reviewed by me and considered in my medical decision making (see chart for details).     MDM  Screen complete  Patient is presenting for likely folliculitis.  Patient's history and exam are consistent with folliculitis.  Screening labs do not reveal any other significant pathology.  Patient has been seen by social work and provided with resources for prescription assistance.  Patient understands the need for close follow-up.  Strict return precautions are given and understood. Final Clinical Impressions(s) / ED Diagnoses   Final diagnoses:  Folliculitis    ED Discharge Orders        Ordered    sulfamethoxazole-trimethoprim (BACTRIM DS,SEPTRA DS) 800-160 MG tablet  2 times daily     06/16/18 1829    bacitracin ointment  2 times daily     06/16/18 1829       Wynetta FinesMessick, Jeanifer Halliday C, MD 06/16/18 [phone]

## 2018-06-17 ENCOUNTER — Telehealth: Payer: Self-pay

## 2018-06-17 NOTE — Telephone Encounter (Signed)
Message received from Michel BickersWandalyn Rogers, RN CM requesting a hospital follow up appointment for the [facility] at [facility]. Attempted to contact the patient as there is an appointment available 06/27/18 @ 1500. Call placed to # [phone] and a HIPAA compliant voicemail message was left requesting a call back to # [phone].  The appointment may not be available when/if she calls back.  Update provided to Beecher McardleW. Rogers. RN/CM

## 2018-06-20 ENCOUNTER — Telehealth: Payer: Self-pay | Admitting: General Practice

## 2018-06-20 NOTE — Telephone Encounter (Signed)
Call placed to patient #[phone], regarding a hospital f/u appointment at [facility]. Spoke with patient and she agreed to be seen by one of our provider. Scheduled appointment for 6/25 at 9:10am. Gave patient office information. Patient was appreciative.   Update being sent  to [facility] HospitalWandalyn, case Production designer, theatre/television/filmmanager.

## 2018-06-24 ENCOUNTER — Ambulatory Visit: Payer: Medicaid Other | Admitting: Internal Medicine

## 2018-10-06 ENCOUNTER — Encounter (HOSPITAL_COMMUNITY): Payer: Self-pay

## 2018-10-06 ENCOUNTER — Emergency Department (HOSPITAL_COMMUNITY)
Admission: EM | Admit: 2018-10-06 | Discharge: 2018-10-06 | Disposition: A | Discharge status: Home / Self Care | Payer: Medicaid Other | Attending: Emergency Medicine | Admitting: Emergency Medicine

## 2018-10-06 DIAGNOSIS — Y92002 Bathroom of unspecified non-institutional (private) residence single-family (private) house as the place of occurrence of the external cause: Secondary | ICD-10-CM | POA: Insufficient documentation

## 2018-10-06 DIAGNOSIS — I10 Essential (primary) hypertension: Secondary | ICD-10-CM | POA: Insufficient documentation

## 2018-10-06 DIAGNOSIS — Y939 Activity, unspecified: Secondary | ICD-10-CM | POA: Diagnosis not present

## 2018-10-06 DIAGNOSIS — Z79899 Other long term (current) drug therapy: Secondary | ICD-10-CM | POA: Diagnosis not present

## 2018-10-06 DIAGNOSIS — W228XXA Striking against or struck by other objects, initial encounter: Secondary | ICD-10-CM | POA: Diagnosis not present

## 2018-10-06 DIAGNOSIS — S01111A Laceration without foreign body of right eyelid and periocular area, initial encounter: Secondary | ICD-10-CM | POA: Diagnosis not present

## 2018-10-06 DIAGNOSIS — S0181XA Laceration without foreign body of other part of head, initial encounter: Secondary | ICD-10-CM | POA: Diagnosis not present

## 2018-10-06 DIAGNOSIS — Y999 Unspecified external cause status: Secondary | ICD-10-CM | POA: Diagnosis not present

## 2018-10-06 DIAGNOSIS — Z87891 Personal history of nicotine dependence: Secondary | ICD-10-CM | POA: Insufficient documentation

## 2018-10-06 DIAGNOSIS — J45909 Unspecified asthma, uncomplicated: Secondary | ICD-10-CM | POA: Insufficient documentation

## 2018-10-06 DIAGNOSIS — S0191XA Laceration without foreign body of unspecified part of head, initial encounter: Secondary | ICD-10-CM
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
## 2018-10-06 NOTE — Discharge Instructions (Signed)
We closed your head wound with a medical glue. This will fall off in a few days. It is ok for you to bathe, just don't scrub directly over your wound.  Please watch for any signs of infection such as redness, swelling, discharge, or increased pain. If you experience any of these symptoms see a doctor.  It is possible that you had a mild concussion.  I would recommend taking a day of rest.  Stay in a dark, quiet room.  If the pain and dizziness do not improve over the next day or 2 please see a doctor

## 2019-01-14 NOTE — Congregational Nurse Program (Signed)
  Dept: [phone]   Congregational Nurse Program Note  Date of Encounter: 01/14/2019  Past Medical History: Past Medical History:  Diagnosis Date  . Anemia   . Asthma   . Depression   . Headache(784.0)   . Hypertension     Encounter Details: CNP Questionnaire - 01/14/19 1901      Questionnaire   Patient Status  Not Applicable    Race  Black or African American    Location Patient Served At  [employer]    Uninsured  Uninsured (NEW 1x/quarter)    Food  Yes, have food insecurities    Housing/Utilities  No permanent housing    Transportation  Yes, need transportation assistance   bus tickets will be given by SSW   Interpersonal Safety  Yes, feel physically and emotionally safe where you currently live    Medication  Yes, have medication insecurities    Medical Provider  No    Referrals  Area Agency   get her in touch with the help she needs for her and her family  also suggested [facility]   ED Visit Averted  Not Applicable    Life-Saving Intervention Made  Not Applicable      Client has a lot of personal concerns. Talked with  Her and she also was able to talk with the SSW. We will try and get as much help for her as we can She will be moving to the [employer] home next week but I informed her that she still could  get some continued help at [facility] and other local agencies. Will follow next week. [address] RN BSN CN  [facility] PhD. [phone].Marland Kitchen

## 2019-03-10 DIAGNOSIS — F331 Major depressive disorder, recurrent, moderate: Secondary | ICD-10-CM | POA: Diagnosis not present

## 2019-03-10 DIAGNOSIS — F4310 Post-traumatic stress disorder, unspecified: Secondary | ICD-10-CM | POA: Diagnosis not present

## 2019-03-17 DIAGNOSIS — F4310 Post-traumatic stress disorder, unspecified: Secondary | ICD-10-CM | POA: Diagnosis not present

## 2019-03-17 DIAGNOSIS — F331 Major depressive disorder, recurrent, moderate: Secondary | ICD-10-CM | POA: Diagnosis not present

## 2019-03-24 DIAGNOSIS — E0800 Diabetes mellitus due to underlying condition with hyperosmolarity without nonketotic hyperglycemic-hyperosmolar coma (NKHHC): Secondary | ICD-10-CM

## 2019-03-25 LAB — GLUCOSE, POCT (MANUAL RESULT ENTRY)

## 2019-03-25 NOTE — Congregational Nurse Program (Signed)
  Dept: [phone]   Congregational Nurse Program Note  Date of Encounter: 03/24/2019  Past Medical History: Past Medical History:  Diagnosis Date  . Anemia   . Asthma   . Depression   . Headache(784.0)   . Hypertension     Encounter Details: CNP Questionnaire - 03/25/19 1721      Questionnaire   Patient Status  Not Applicable    Race  Black or African American    Location Patient Served At  Not Applicable    Insurance  Not Applicable    Uninsured  Uninsured (Subsequent visits/quarter)    Food  No food insecurities    Housing/Utilities  No permanent housing    Transportation  No transportation needs    Interpersonal Safety  Yes, feel physically and emotionally safe where you currently live    Medication  No medication insecurities    Medical Provider  No    Referrals  Area Agency;Primary Care Provider/Clinic    ED Visit Averted  Not Applicable    Life-Saving Intervention Made  Not Applicable     requesting her blood pressure be taken as he feels she has high blood pressure. States she has been feeling dizzy and has headaches . History of migraine headaches .Nurse has given her information on managing headaches ,doesn't want to take medication that would alter her alertness as she must be able to care for her children.. She has been a smoker for 20 or more years ,since she was 34 years of age and has weaned it down to 1 pack per week . Counseled on effects of long term smoking . Expressed she has some tingling in hands and legs sometimes,counseled again on smoking and heart disease . Blood pressure and blood sugar was fine ,counseled on low blood sugars and symptoms it may cause ,gave client a sheet on signs and symptoms .B/p was 99/67, blood sugar 8 8mg  at 3:52 pm before dinner .Nutritional counseling given ,as she worked at [employer]  On what is healthy . Referred to establish care under a PCP and get basic labs done to better access her health ,number and address given ,will  call as soon a appointments are being accepted for routine care. Will call [facility] clinic .

## 2019-03-26 DIAGNOSIS — F4310 Post-traumatic stress disorder, unspecified: Secondary | ICD-10-CM | POA: Diagnosis not present

## 2019-03-26 DIAGNOSIS — F331 Major depressive disorder, recurrent, moderate: Secondary | ICD-10-CM | POA: Diagnosis not present

## 2019-04-08 NOTE — Congregational Nurse Program (Signed)
  Dept: [phone]   Congregational Nurse Program Note  Date of Encounter: 04/08/2019  Past Medical History: Past Medical History:  Diagnosis Date  . Anemia   . Asthma   . Depression   . Headache(784.0)   . Hypertension     Encounter Details: CNP Questionnaire - 04/08/19 1919      Questionnaire   Patient Status  Not Applicable    Race  Black or African American    Location Patient Served At  Not Applicable    Insurance  Not Applicable    Uninsured  Uninsured (Subsequent visits/quarter)    Food  No food insecurities    Housing/Utilities  No permanent housing    Transportation  No transportation needs    Interpersonal Safety  Yes, feel physically and emotionally safe where you currently live    Medication  No medication insecurities    Medical Provider  No    Referrals  Primary Care Provider/Clinic;Behavioral/Mental Health Provider    ED Visit Averted  Not Applicable    Life-Saving Intervention Made  Not Applicable     Case management shared that client was having problems sleeping at night,not eating and feeling depressed as client shared this information with her therapy counselor and they requested client be seen by a PCP which she doesn't have but has been referred and at the present no new clients are being seen . Nurse then text ed her medical back up Dr Shan Levans explaining the situation and he has agreed to see client through an  e -visit next Tuesday  Nurse talked with [name] and she states she is having problems with being quarantined   ,facing depression and its a lot for her to deal with . Open to an e-visit and will be on look out for a call to see what the provider may recommend.Wants to connect with a PCP .  Follow up next week

## 2019-04-14 ENCOUNTER — Telehealth: Payer: Self-pay

## 2019-04-14 ENCOUNTER — Ambulatory Visit: Payer: Medicaid Other | Attending: Critical Care Medicine | Admitting: Critical Care Medicine

## 2019-04-14 ENCOUNTER — Other Ambulatory Visit: Payer: Self-pay

## 2019-04-14 ENCOUNTER — Encounter: Payer: Self-pay | Admitting: Critical Care Medicine

## 2019-04-14 DIAGNOSIS — F419 Anxiety disorder, unspecified: Secondary | ICD-10-CM

## 2019-04-14 NOTE — Progress Notes (Deleted)
Virtual Visit via Telephone Note  I connected with Nicole Fry on 04/14/19 at  9:30 AM EDT by telephone and verified that I am speaking with the correct person using two identifiers.   I discussed the limitations, risks, security and privacy concerns of performing an evaluation and management service by telephone and the availability of in person appointments. I also discussed with the patient that there may be a patient responsible charge related to this service. The patient expressed understanding and agreed to proceed.   History of Present Illness: This is a new patient homeless with children and experiencing severe stress. Anemia    . Asthma   . Depression   . Headache(784.0)   . Hypertension    Case management shared that client was having problems sleeping at night,not eating and feeling depressed as client shared this information with her therapy counselor and they requested client be seen by a PCP which she doesn't have but has been referred and at the present no new clients are being seen . Nurse then text ed her medical back up Nicole Fry explaining the situation and he has agreed to see client through an  e -visit next Tuesday  Nurse talked with Nicole Fry and she states she is having problems with being quarantined   ,facing depression and its a lot for her to deal with . Open to an e-visit and will be on look out for a call to see what the provider may recommend.Wants to connect with a PCP .  Follow up next week    Constitutional:   No  weight loss, night sweats,  Fevers, chills, fatigue, lassitude. HEENT:   No headaches,  Difficulty swallowing,  Tooth/dental problems,  Sore throat,                No sneezing, itching, ear ache, nasal congestion, post nasal drip,   CV:  No chest pain,  Orthopnea, PND, swelling in lower extremities, anasarca, dizziness, palpitations  GI  No heartburn, indigestion, abdominal pain, nausea, vomiting, diarrhea, change in bowel habits,  loss of appetite  Resp: No shortness of breath with exertion or at rest.  No excess mucus, no productive cough,  No non-productive cough,  No coughing up of blood.  No change in color of mucus.  No wheezing.  No chest wall deformity  Skin: no rash or lesions.  GU: no dysuria, change in color of urine, no urgency or frequency.  No flank pain.  MS:  No joint pain or swelling.  No decreased range of motion.  No back pain.  Psych:  No change in mood or affect. No depression or anxiety.  No memory loss.     Observations/Objective:   Assessment and Plan:   Follow Up Instructions:    I discussed the assessment and treatment plan with the patient. The patient was provided an opportunity to ask questions and all were answered. The patient agreed with the plan and demonstrated an understanding of the instructions.   The patient was advised to call back or seek an in-person evaluation if the symptoms worsen or if the condition fails to improve as anticipated.  I provided *** minutes of non-face-to-face time during this encounter.   Shan Levans, MD

## 2019-04-14 NOTE — Progress Notes (Addendum)
Left voicemail on 04/14/19 @ 9:14 to let patient know that I was attempting to contact her to initiate phone visit.  Left voicemail on 04/14/19 @ 9:22 in attempt to initiate phone visit.  Left voicemail on 04/14/19 @ 9:30 in attempt to initiate phone visit.  Left voicemail on 04/14/19 @ 9:36 in attempt to initiate phone visit.  Left voicemail on 04/14/19 @ 9:55 in attempt to initiate phone visit.

## 2019-04-14 NOTE — Telephone Encounter (Signed)
Call to check why client missed her e-visit with Dr Luisa Hart today . He will attempt to call her back this pm. Stressed importance of  Listening out for return call. Also would like to see client this pm to review visit . Case management will make contact with client and stress importance  Of follow up

## 2019-04-22 ENCOUNTER — Telehealth: Payer: Self-pay

## 2019-04-22 NOTE — Telephone Encounter (Signed)
TC to Health Dept to check on TB exposure of family . Verified that there was a case at the [facility] and all residents were being notified and being screened . Made arrangements to have family screened on site Tuesday pm between  2 pm and 3 pm

## 2019-04-22 NOTE — Congregational Nurse Program (Signed)
  Dept: [phone]   Congregational Nurse Program Note  Date of Encounter: 04/21/2019  Past Medical History: Past Medical History:  Diagnosis Date  . Anemia   . Asthma   . Depression   . Headache(784.0)   . Hypertension     Encounter Details: CNP Questionnaire - 04/22/19 1804      Questionnaire   Patient Status  Not Applicable    Race  Black or African American    Location Patient Served At  Not Applicable    Insurance  Not Applicable    Uninsured  Uninsured (Subsequent visits/quarter)    Food  No food insecurities    Housing/Utilities  No permanent housing    Transportation  No transportation needs    Interpersonal Safety  Yes, feel physically and emotionally safe where you currently live    Medication  No medication insecurities    Medical Provider  No    Referrals  Area Agency;Primary Care Provider/Clinic    ED Visit Averted  Not Applicable    Life-Saving Intervention Made  Not Applicable     Client and family being screened for contact [facility] TB [facility] . Health dept staff on site and administrated screening. Nurse gave client information on TB symptoms ,the disease and how it is contracted . She states she nor her family have shown any symptoms . PPD's will be read on Friday  Between the hours of 2 pm -3 pm . Understands importance of the readings to prevent re testing. .  Case management  To make sure readings are done to support family and nurse will follow up as needed

## 2019-04-22 NOTE — Telephone Encounter (Signed)
Nurse consulted with case management and will call Health Dept to verify and she what the next steps can be to get the family screened.

## 2019-04-22 NOTE — Telephone Encounter (Signed)
Tc to case manager regarding TB follow up . Family will need to be screened and tested due to contact at [facility] . Will inform  Mother of testing that will be done 04-21-2019 between 2 pm -3 pm by Health dept at the [facility]

## 2019-04-23 DIAGNOSIS — F331 Major depressive disorder, recurrent, moderate: Secondary | ICD-10-CM | POA: Diagnosis not present

## 2019-04-23 DIAGNOSIS — F4310 Post-traumatic stress disorder, unspecified: Secondary | ICD-10-CM | POA: Diagnosis not present

## 2019-04-28 DIAGNOSIS — F331 Major depressive disorder, recurrent, moderate: Secondary | ICD-10-CM | POA: Diagnosis not present

## 2019-04-28 DIAGNOSIS — F4310 Post-traumatic stress disorder, unspecified: Secondary | ICD-10-CM | POA: Diagnosis not present

## 2019-04-29 NOTE — Congregational Nurse Program (Signed)
  Dept: [phone]   Congregational Nurse Program Note  Date of Encounter: 04/28/2019  Past Medical History: Past Medical History:  Diagnosis Date  . Anemia   . Asthma   . Depression   . Headache(784.0)   . Hypertension     Encounter Details: CNP Questionnaire - 04/29/19 1827      Questionnaire   Patient Status  Not Applicable    Race  Black or African American    Location Patient Served At  Not Applicable    Insurance  Not Applicable    Uninsured  Uninsured (Subsequent visits/quarter)    Food  No food insecurities    Housing/Utilities  No permanent housing    Transportation  No transportation needs;Within past 12 months, lack of transportation negatively impacted life    Interpersonal Safety  Yes, feel physically and emotionally safe where you currently live    Medication  No medication insecurities    Medical Provider  No    Referrals  Primary Care Provider/Clinic    ED Visit Averted  Not Applicable    Life-Saving Intervention Made  Not Applicable      Brief encounter with client as she was walking down the hall,ask if Health dept came out on Friday to read PPD's on her and family.She replied yes and all were negatives ,every one fine.  Had questions about her daughter's eye??  Crusting in am ,counseled and ask her  referred her to call pediatrician for e-visit to determine course of action  Follow as needed

## 2019-04-30 DIAGNOSIS — F4310 Post-traumatic stress disorder, unspecified: Secondary | ICD-10-CM | POA: Diagnosis not present

## 2019-04-30 DIAGNOSIS — F331 Major depressive disorder, recurrent, moderate: Secondary | ICD-10-CM | POA: Diagnosis not present

## 2019-05-05 NOTE — Congregational Nurse Program (Signed)
  Dept: [phone]   Congregational Nurse Program Note  Date of Encounter: 05/05/2019  Past Medical History: Past Medical History:  Diagnosis Date  . Anemia   . Asthma   . Depression   . Headache(784.0)   . Hypertension     Encounter Details: CNP Questionnaire - 05/05/19 1927      Questionnaire   Patient Status  Not Applicable    Race  Black or African American    Location Patient Served At  Not Applicable    Insurance  Not Applicable    Uninsured  Uninsured (NEW 1x/quarter)    Food  No food insecurities    Housing/Utilities  No permanent housing    Transportation  No transportation needs    Interpersonal Safety  Yes, feel physically and emotionally safe where you currently live    Medication  Yes, have medication insecurities    Medical Provider  No    Referrals  Primary Care Provider/Clinic    ED Visit Averted  Not Applicable    Life-Saving Intervention Made  Not Applicable      Brief encounter in hallway but states she has a headache not a migraine yet . Suggested she take measures to prevent a migraine . Center has some Advil on hand will take that to get relief.  Follow as needed ,needs PCP appointment and blood work ,will follow up to get scheduled.

## 2019-05-07 DIAGNOSIS — F331 Major depressive disorder, recurrent, moderate: Secondary | ICD-10-CM | POA: Diagnosis not present

## 2019-05-07 DIAGNOSIS — F4310 Post-traumatic stress disorder, unspecified: Secondary | ICD-10-CM | POA: Diagnosis not present

## 2019-05-12 DIAGNOSIS — F4310 Post-traumatic stress disorder, unspecified: Secondary | ICD-10-CM | POA: Diagnosis not present

## 2019-05-12 DIAGNOSIS — F331 Major depressive disorder, recurrent, moderate: Secondary | ICD-10-CM | POA: Diagnosis not present

## 2019-05-14 DIAGNOSIS — F4310 Post-traumatic stress disorder, unspecified: Secondary | ICD-10-CM | POA: Diagnosis not present

## 2019-05-14 DIAGNOSIS — F331 Major depressive disorder, recurrent, moderate: Secondary | ICD-10-CM | POA: Diagnosis not present

## 2019-05-19 DIAGNOSIS — F4310 Post-traumatic stress disorder, unspecified: Secondary | ICD-10-CM | POA: Diagnosis not present

## 2019-05-19 DIAGNOSIS — F331 Major depressive disorder, recurrent, moderate: Secondary | ICD-10-CM | POA: Diagnosis not present

## 2019-05-21 DIAGNOSIS — F331 Major depressive disorder, recurrent, moderate: Secondary | ICD-10-CM | POA: Diagnosis not present

## 2019-05-21 DIAGNOSIS — F4310 Post-traumatic stress disorder, unspecified: Secondary | ICD-10-CM | POA: Diagnosis not present

## 2019-05-26 DIAGNOSIS — F4310 Post-traumatic stress disorder, unspecified: Secondary | ICD-10-CM | POA: Diagnosis not present

## 2019-05-26 DIAGNOSIS — F331 Major depressive disorder, recurrent, moderate: Secondary | ICD-10-CM | POA: Diagnosis not present

## 2019-05-28 DIAGNOSIS — F4310 Post-traumatic stress disorder, unspecified: Secondary | ICD-10-CM | POA: Diagnosis not present

## 2019-05-28 DIAGNOSIS — F331 Major depressive disorder, recurrent, moderate: Secondary | ICD-10-CM | POA: Diagnosis not present

## 2019-06-05 DIAGNOSIS — F331 Major depressive disorder, recurrent, moderate: Secondary | ICD-10-CM | POA: Diagnosis not present

## 2019-06-05 DIAGNOSIS — F4310 Post-traumatic stress disorder, unspecified: Secondary | ICD-10-CM | POA: Diagnosis not present

## 2019-06-09 ENCOUNTER — Telehealth: Payer: Self-pay

## 2019-06-09 DIAGNOSIS — F4310 Post-traumatic stress disorder, unspecified: Secondary | ICD-10-CM | POA: Diagnosis not present

## 2019-06-09 DIAGNOSIS — F331 Major depressive disorder, recurrent, moderate: Secondary | ICD-10-CM | POA: Diagnosis not present

## 2019-06-09 NOTE — Congregational Nurse Program (Signed)
  Dept: [place] Nurse Program Note  Date of Encounter: 06/09/2019  Past Medical History: Past Medical History:  Diagnosis Date  . Anemia   . Asthma   . Depression   . Headache(784.0)   . Hypertension     Encounter Details: CNP Questionnaire - 06/09/19 1801      Questionnaire   Patient Status  Not Applicable    Race  Black or African American    Location Patient Served At  Not Applicable    Insurance  Medicaid    Uninsured  Not Applicable    Food  No food insecurities    Housing/Utilities  No permanent housing    Transportation  No transportation needs    Interpersonal Safety  Yes, feel physically and emotionally safe where you currently live    Medication  No medication insecurities    Medical Provider  No    Referrals  Primary Care Provider/Clinic     Nurse talked with client as she was leaving the center today. States she is fine and her family. Ask if she ever called to get established care with a PCP  ,she replied no and due to  due to COVID-19  very few appointments were being made. Has no PCP. Nurse informed her that she would try to get her an appointment today and let her know if she is successful ,she agrees and thanked the nurse.

## 2019-06-09 NOTE — Telephone Encounter (Signed)
Nurse called to get client established with care as she has no PCP and has a history of depression and no regular care. Appointment made for   06-17-2019 @ 2 :30 pm . ,Address and time given to client and case manager made aware of appointment.

## 2019-06-10 NOTE — Congregational Nurse Program (Signed)
  Dept: [place] Nurse Program Note  Date of Encounter: 06/10/2019  Past Medical History: Past Medical History:  Diagnosis Date  . Anemia   . Asthma   . Depression   . Headache(784.0)   . Hypertension     Encounter Details: CNP Questionnaire - 06/10/19 1851      Questionnaire   Patient Status  Not Applicable    Race  Black or African American    Location Patient Served At  Not Applicable    Insurance  Medicaid    Uninsured  Not Applicable    Food  No food insecurities    Housing/Utilities  No permanent housing    Transportation  No transportation needs    Interpersonal Safety  Yes, feel physically and emotionally safe where you currently live    Medication  No medication insecurities    Medical Provider  No    Referrals  Primary Care Provider/Clinic    ED Visit Averted  Not Applicable    Life-Saving Intervention Made  Not Applicable     brief encounter in hallway to remind of appointment set for next week with PCP. States she will be able to get there.  Follow up next week

## 2019-06-11 DIAGNOSIS — F331 Major depressive disorder, recurrent, moderate: Secondary | ICD-10-CM | POA: Diagnosis not present

## 2019-06-11 DIAGNOSIS — F4310 Post-traumatic stress disorder, unspecified: Secondary | ICD-10-CM | POA: Diagnosis not present

## 2019-06-13 ENCOUNTER — Other Ambulatory Visit: Payer: Self-pay | Admitting: *Deleted

## 2019-06-13 DIAGNOSIS — Z20822 Contact with and (suspected) exposure to covid-19: Secondary | ICD-10-CM

## 2019-06-15 LAB — NOVEL CORONAVIRUS, NAA: SARS-CoV-2, NAA: NOT DETECTED

## 2019-06-17 ENCOUNTER — Other Ambulatory Visit: Payer: Self-pay

## 2019-06-17 ENCOUNTER — Encounter: Payer: Self-pay | Admitting: Family Medicine

## 2019-06-17 ENCOUNTER — Ambulatory Visit (INDEPENDENT_AMBULATORY_CARE_PROVIDER_SITE_OTHER): Payer: Medicaid Other | Admitting: Family Medicine

## 2019-06-17 DIAGNOSIS — Z5900 Homelessness unspecified: Secondary | ICD-10-CM

## 2019-06-17 DIAGNOSIS — F330 Major depressive disorder, recurrent, mild: Secondary | ICD-10-CM

## 2019-06-17 DIAGNOSIS — F4310 Post-traumatic stress disorder, unspecified: Secondary | ICD-10-CM | POA: Diagnosis not present

## 2019-06-17 DIAGNOSIS — F331 Major depressive disorder, recurrent, moderate: Secondary | ICD-10-CM | POA: Diagnosis not present

## 2019-06-17 MED ORDER — ESCITALOPRAM OXALATE 10 MG PO TABS: 10.0000 mg | ORAL_TABLET | Freq: Every day | ORAL | 0 refills | Status: AC

## 2019-06-17 NOTE — Patient Instructions (Signed)
Thank you for choosing Primary Care at [facility] to be your medical home!    Nicole Fry was seen by Molli Barrows, FNP today.   Phil Dopp primary care provider is Scot Jun, FNP.   For the best care possible, you should try to see Molli Barrows, FNP-C whenever you come to the clinic.   We look forward to seeing you again soon!  If you have any questions about your visit today, please call us at [phone] or feel free to reach your primary care provider via [place].

## 2019-06-17 NOTE — Progress Notes (Deleted)
Called patient to initiate their telephone visit with provider Molli Barrows, FNP-C. Verified date of birth. States that she has had depression since she was a child. Has never been treated. KWalker, CMA.

## 2019-06-17 NOTE — Progress Notes (Signed)
Virtual Visit via Telephone Note  I connected with Nicole Fry on 06/17/19 at  2:30 PM EDT by telephone and verified that I am speaking with the correct person using two identifiers.  Location: Patient: Located at home during today's encounter. Provider: Located at home during today's encounter    I discussed the limitations, risks, security and privacy concerns of performing an evaluation and management service by telephone and the availability of in person appointments. I also discussed with the patient that there may be a patient responsible charge related to this service. The patient expressed understanding and agreed to proceed.   History of Present Illness: Nicole Fry a history of depression.  No prior treatment.  She is currently seeing a counselor as she is currently residing in a shelter which meets with the residents once per week.  She reports being homeless intermittently over the last 3 years. She has children that reside with her at the shelter. 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  Her current symptoms of anxiety and depression are heightened due to the death of her uncle approximately 3 years ago as well as her current living situation. Long history of depression symptoms without treatment. Receives counseling at shelter. Interested in medication therapy. Denies suicidal ideations, homicidal ideations, or auditory hallucinations.  Family History  Problem Relation Age of Onset  . Hypertension Mother   . Hypertension Maternal Grandmother    Social History   Socioeconomic History  . Marital status: Single    Spouse name: Not on file  . Number of children: Not on file  . Years of education: Not on file  . Highest education level: Not on file  Occupational History  . Not on file  Social Needs  . Financial resource strain: Not on file  . Food insecurity    Worry: Not on file    Inability: Not on file  . Transportation needs   Medical: Not on file    Non-medical: Not on file  Tobacco Use  . Smoking status: Current Every Day Smoker    Types: Cigarettes  . Smokeless tobacco: Never Used  . Tobacco comment: 7-9 cigarettes  Substance and Sexual Activity  . Alcohol use: Yes  . Drug use: No  . Sexual activity: Not on file  Lifestyle  . Physical activity    Days per week: Not on file    Minutes per session: Not on file  . Stress: Not on file  Relationships  . Social Musicianconnections    Talks on phone: Not on file    Gets together: Not on file    Attends religious service: Not on file    Active member of club or organization: Not on file    Attends meetings of clubs or organizations: Not on file    Relationship status: Not on file  . Intimate partner violence    Fear of current or ex partner: Not on file    Emotionally abused: Not on file    Physically abused: Not on file    Forced sexual activity: Not on file  Other Topics Concern  . Not on file  Social History Narrative  . Not on file    Assessment and Plan: 1. Mild episode of recurrent major depressive disorder (HCC) 2. Homeless family Will trial escitalopram 10 mg once daily  Encouraged to continue counseling services.  Follow Up Instructions: RTC: 4-6 weeks to follow-up in depression and medication effectiveness.   I discussed the  assessment and treatment plan with the patient. The patient was provided an opportunity to ask questions and all were answered. The patient agreed with the plan and demonstrated an understanding of the instructions.   The patient was advised to call back or seek an in-person evaluation if the symptoms worsen or if the condition fails to improve as anticipated.  I provided 89minutes of non-face-to-face time during this encounter.   Molli Barrows, FNP

## 2019-06-24 DIAGNOSIS — F4310 Post-traumatic stress disorder, unspecified: Secondary | ICD-10-CM | POA: Diagnosis not present

## 2019-06-24 DIAGNOSIS — F331 Major depressive disorder, recurrent, moderate: Secondary | ICD-10-CM | POA: Diagnosis not present

## 2019-06-26 DIAGNOSIS — F4310 Post-traumatic stress disorder, unspecified: Secondary | ICD-10-CM | POA: Diagnosis not present

## 2019-06-26 DIAGNOSIS — F331 Major depressive disorder, recurrent, moderate: Secondary | ICD-10-CM | POA: Diagnosis not present

## 2019-06-29 DIAGNOSIS — F4310 Post-traumatic stress disorder, unspecified: Secondary | ICD-10-CM | POA: Diagnosis not present

## 2019-06-29 DIAGNOSIS — F331 Major depressive disorder, recurrent, moderate: Secondary | ICD-10-CM | POA: Diagnosis not present

## 2019-07-06 DIAGNOSIS — F4310 Post-traumatic stress disorder, unspecified: Secondary | ICD-10-CM | POA: Diagnosis not present

## 2019-07-06 DIAGNOSIS — F331 Major depressive disorder, recurrent, moderate: Secondary | ICD-10-CM | POA: Diagnosis not present

## 2019-07-14 ENCOUNTER — Telehealth: Payer: Self-pay

## 2019-07-14 DIAGNOSIS — F4310 Post-traumatic stress disorder, unspecified: Secondary | ICD-10-CM | POA: Diagnosis not present

## 2019-07-14 DIAGNOSIS — F331 Major depressive disorder, recurrent, moderate: Secondary | ICD-10-CM | POA: Diagnosis not present

## 2019-07-14 NOTE — Telephone Encounter (Signed)
Called patient to do their pre-visit COVID screening.  Call went to voicemail. Unable to do prescreening.  

## 2019-07-15 ENCOUNTER — Ambulatory Visit: Payer: Medicaid Other | Admitting: Family Medicine

## 2019-07-16 DIAGNOSIS — F331 Major depressive disorder, recurrent, moderate: Secondary | ICD-10-CM | POA: Diagnosis not present

## 2019-07-16 DIAGNOSIS — F4310 Post-traumatic stress disorder, unspecified: Secondary | ICD-10-CM | POA: Diagnosis not present

## 2019-07-27 DIAGNOSIS — F4310 Post-traumatic stress disorder, unspecified: Secondary | ICD-10-CM | POA: Diagnosis not present

## 2019-07-27 DIAGNOSIS — F331 Major depressive disorder, recurrent, moderate: Secondary | ICD-10-CM | POA: Diagnosis not present

## 2019-08-10 DIAGNOSIS — F4310 Post-traumatic stress disorder, unspecified: Secondary | ICD-10-CM | POA: Diagnosis not present

## 2019-08-10 DIAGNOSIS — F331 Major depressive disorder, recurrent, moderate: Secondary | ICD-10-CM | POA: Diagnosis not present

## 2019-08-13 DIAGNOSIS — F4310 Post-traumatic stress disorder, unspecified: Secondary | ICD-10-CM | POA: Diagnosis not present

## 2019-08-13 DIAGNOSIS — F331 Major depressive disorder, recurrent, moderate: Secondary | ICD-10-CM | POA: Diagnosis not present

## 2019-08-17 DIAGNOSIS — F4310 Post-traumatic stress disorder, unspecified: Secondary | ICD-10-CM | POA: Diagnosis not present

## 2019-08-17 DIAGNOSIS — F331 Major depressive disorder, recurrent, moderate: Secondary | ICD-10-CM | POA: Diagnosis not present

## 2019-08-20 DIAGNOSIS — F331 Major depressive disorder, recurrent, moderate: Secondary | ICD-10-CM | POA: Diagnosis not present

## 2019-08-20 DIAGNOSIS — F4310 Post-traumatic stress disorder, unspecified: Secondary | ICD-10-CM | POA: Diagnosis not present

## 2019-08-23 DIAGNOSIS — F4310 Post-traumatic stress disorder, unspecified: Secondary | ICD-10-CM | POA: Diagnosis not present

## 2019-08-23 DIAGNOSIS — F331 Major depressive disorder, recurrent, moderate: Secondary | ICD-10-CM | POA: Diagnosis not present

## 2019-09-01 DIAGNOSIS — F4310 Post-traumatic stress disorder, unspecified: Secondary | ICD-10-CM | POA: Diagnosis not present

## 2019-09-01 DIAGNOSIS — F331 Major depressive disorder, recurrent, moderate: Secondary | ICD-10-CM | POA: Diagnosis not present

## 2019-09-18 DIAGNOSIS — F4310 Post-traumatic stress disorder, unspecified: Secondary | ICD-10-CM | POA: Diagnosis not present

## 2019-09-18 DIAGNOSIS — F331 Major depressive disorder, recurrent, moderate: Secondary | ICD-10-CM | POA: Diagnosis not present

## 2019-09-23 DIAGNOSIS — F331 Major depressive disorder, recurrent, moderate: Secondary | ICD-10-CM | POA: Diagnosis not present

## 2019-09-23 DIAGNOSIS — F4310 Post-traumatic stress disorder, unspecified: Secondary | ICD-10-CM | POA: Diagnosis not present

## 2019-10-01 DIAGNOSIS — F331 Major depressive disorder, recurrent, moderate: Secondary | ICD-10-CM | POA: Diagnosis not present

## 2019-10-01 DIAGNOSIS — F4310 Post-traumatic stress disorder, unspecified: Secondary | ICD-10-CM | POA: Diagnosis not present

## 2019-10-07 DIAGNOSIS — F4310 Post-traumatic stress disorder, unspecified: Secondary | ICD-10-CM | POA: Diagnosis not present

## 2019-10-07 DIAGNOSIS — F331 Major depressive disorder, recurrent, moderate: Secondary | ICD-10-CM | POA: Diagnosis not present

## 2019-10-12 DIAGNOSIS — F331 Major depressive disorder, recurrent, moderate: Secondary | ICD-10-CM | POA: Diagnosis not present

## 2019-10-12 DIAGNOSIS — F4310 Post-traumatic stress disorder, unspecified: Secondary | ICD-10-CM | POA: Diagnosis not present

## 2019-10-15 DIAGNOSIS — F331 Major depressive disorder, recurrent, moderate: Secondary | ICD-10-CM | POA: Diagnosis not present

## 2019-10-15 DIAGNOSIS — F4310 Post-traumatic stress disorder, unspecified: Secondary | ICD-10-CM | POA: Diagnosis not present

## 2019-10-20 DIAGNOSIS — F4310 Post-traumatic stress disorder, unspecified: Secondary | ICD-10-CM | POA: Diagnosis not present

## 2019-10-20 DIAGNOSIS — F331 Major depressive disorder, recurrent, moderate: Secondary | ICD-10-CM | POA: Diagnosis not present

## 2019-10-22 DIAGNOSIS — F331 Major depressive disorder, recurrent, moderate: Secondary | ICD-10-CM | POA: Diagnosis not present

## 2019-10-22 DIAGNOSIS — F4310 Post-traumatic stress disorder, unspecified: Secondary | ICD-10-CM | POA: Diagnosis not present

## 2019-10-26 ENCOUNTER — Emergency Department (HOSPITAL_COMMUNITY): Payer: Medicaid Other

## 2019-10-26 ENCOUNTER — Emergency Department (HOSPITAL_COMMUNITY)
Admission: EM | Admit: 2019-10-26 | Discharge: 2019-10-26 | Disposition: A | Discharge status: Home / Self Care | Payer: Medicaid Other | Attending: Emergency Medicine | Admitting: Emergency Medicine

## 2019-10-26 ENCOUNTER — Other Ambulatory Visit: Payer: Self-pay

## 2019-10-26 ENCOUNTER — Encounter (HOSPITAL_COMMUNITY): Payer: Self-pay | Admitting: Emergency Medicine

## 2019-10-26 DIAGNOSIS — F17210 Nicotine dependence, cigarettes, uncomplicated: Secondary | ICD-10-CM | POA: Diagnosis not present

## 2019-10-26 DIAGNOSIS — R062 Wheezing: Secondary | ICD-10-CM | POA: Insufficient documentation

## 2019-10-26 DIAGNOSIS — R0602 Shortness of breath: Secondary | ICD-10-CM | POA: Diagnosis not present

## 2019-10-26 DIAGNOSIS — N3000 Acute cystitis without hematuria: Secondary | ICD-10-CM | POA: Insufficient documentation

## 2019-10-26 DIAGNOSIS — R0789 Other chest pain: Secondary | ICD-10-CM | POA: Diagnosis not present

## 2019-10-26 DIAGNOSIS — Z202 Contact with and (suspected) exposure to infections with a predominantly sexual mode of transmission: Secondary | ICD-10-CM | POA: Insufficient documentation

## 2019-10-26 DIAGNOSIS — N926 Irregular menstruation, unspecified: Secondary | ICD-10-CM | POA: Insufficient documentation

## 2019-10-26 DIAGNOSIS — Z79899 Other long term (current) drug therapy: Secondary | ICD-10-CM | POA: Insufficient documentation

## 2019-10-26 DIAGNOSIS — Z20828 Contact with and (suspected) exposure to other viral communicable diseases: Secondary | ICD-10-CM | POA: Diagnosis not present

## 2019-10-26 DIAGNOSIS — N63 Unspecified lump in unspecified breast: Secondary | ICD-10-CM | POA: Insufficient documentation

## 2019-10-26 DIAGNOSIS — I10 Essential (primary) hypertension: Secondary | ICD-10-CM | POA: Diagnosis not present

## 2019-10-26 HISTORY — DX: Pneumonia, unspecified organism: J18.9

## 2019-10-26 LAB — URINALYSIS, ROUTINE W REFLEX MICROSCOPIC
Bilirubin Urine: NEGATIVE
Glucose, UA: NEGATIVE mg/dL
Ketones, ur: 5 mg/dL — AB
Nitrite: NEGATIVE
Protein, ur: NEGATIVE mg/dL
Specific Gravity, Urine: 1.004 — ABNORMAL LOW (ref 1.005–1.030)
pH: 7 (ref 5.0–8.0)

## 2019-10-26 LAB — BASIC METABOLIC PANEL
Anion gap: 11 (ref 5–15)
BUN: 6 mg/dL (ref 6–20)
CO2: 24 mmol/L (ref 22–32)
Calcium: 9.6 mg/dL (ref 8.9–10.3)
Chloride: 104 mmol/L (ref 98–111)
Creatinine, Ser: 1.06 mg/dL — ABNORMAL HIGH (ref 0.44–1.00)
GFR calc Af Amer: >60 mL/min (ref >60)
GFR calc non Af Amer: >60 mL/min (ref >60)
Glucose, Bld: 92 mg/dL (ref 70–99)
Potassium: 4.2 mmol/L (ref 3.5–5.1)
Sodium: 139 mmol/L (ref 135–145)

## 2019-10-26 LAB — I-STAT BETA HCG BLOOD, ED (MC, WL, AP ONLY): I-stat hCG, quantitative: <5 m[IU]/mL (ref <5)

## 2019-10-26 LAB — CBC
HCT: 44.2 % (ref 36.0–46.0)
Hemoglobin: 15.1 g/dL — ABNORMAL HIGH (ref 12.0–15.0)
MCH: 34.8 pg — ABNORMAL HIGH (ref 26.0–34.0)
MCHC: 34.2 g/dL (ref 30.0–36.0)
MCV: 101.8 fL — ABNORMAL HIGH (ref 80.0–100.0)
Platelets: 236 10*3/uL (ref 150–400)
RBC: 4.34 MIL/uL (ref 3.87–5.11)
RDW: 13.9 % (ref 11.5–15.5)
WBC: 7.5 10*3/uL (ref 4.0–10.5)
nRBC: 0 % (ref 0.0–0.2)

## 2019-10-26 LAB — TROPONIN I (HIGH SENSITIVITY)
Troponin I (High Sensitivity): <2 ng/L (ref <18)
Troponin I (High Sensitivity): <2 ng/L (ref <18)

## 2019-10-26 MED ORDER — CEPHALEXIN 500 MG PO CAPS: 500.0000 mg | ORAL_CAPSULE | Freq: Four times a day (QID) | ORAL | 0 refills | Status: DC

## 2019-10-26 MED ORDER — AZITHROMYCIN 250 MG PO TABS
1000.0000 mg | ORAL_TABLET | Freq: Once | ORAL | Status: AC
  Administered 2019-10-26: 1000 mg via ORAL
  Filled 2019-10-26: qty 4

## 2019-10-26 MED ORDER — CEFTRIAXONE SODIUM 250 MG IJ SOLR
250.0000 mg | Freq: Once | INTRAMUSCULAR | Status: AC
  Administered 2019-10-26: 250 mg via INTRAMUSCULAR
  Filled 2019-10-26: qty 250

## 2019-10-26 MED ORDER — SODIUM CHLORIDE 0.9% FLUSH: 3.0000 mL | Freq: Once | INTRAVENOUS | Status: DC

## 2019-10-26 MED ORDER — DEXAMETHASONE SODIUM PHOSPHATE 10 MG/ML IJ SOLN
10.0000 mg | Freq: Once | INTRAMUSCULAR | Status: AC
  Administered 2019-10-26: 10 mg via INTRAVENOUS
  Filled 2019-10-26: qty 1

## 2019-10-26 MED ORDER — ALBUTEROL SULFATE HFA 108 (90 BASE) MCG/ACT IN AERS
8.0000 | INHALATION_SPRAY | RESPIRATORY_TRACT | Status: DC | PRN
  Administered 2019-10-26: 8 via RESPIRATORY_TRACT
  Filled 2019-10-26: qty 6.7

## 2019-10-26 NOTE — Discharge Instructions (Addendum)
You have been treated for the following conditions tonight:  Wheezing/chest tightness: use your inhaler, 2 puffs every 4-6 hours. Return to the emergency department with any significant shortness of breath, significant wheezing, high fever or chest pain.   Urinary tract infection/STD exposure/irregular menses: You have been given medications based on STD exposure which will cover Gonorrhea and chlamydia. You will also take Keflex as prescribed for a UTI. There is a breast nodule in the left breast on exam and on chest CT. For there concerns, please follow up with [facility] for further management.   A COVID test is pending and should result in 48 hours or less. You can access these results on MyChart as instructed in your discharge papers.   Return to the ED for any emergent medical needs.

## 2019-10-26 NOTE — ED Triage Notes (Addendum)
Pt to triage via [facility]> C/o SOB and chest pain/heaviness all day Sunday.  Respiratory distress on EMS arrival with bilateral rhonchi.  Duo neb and ASA given PTA and lung sounds clearer after neb. Pt also requesting STD check.  States her partner was recently seen in the ED and given a shot and antibiotics and she believes he may have been diagnosed with STD.  Reports pain with urination and pain with intercourse.

## 2019-10-26 NOTE — ED Provider Notes (Signed)
[facility] EMERGENCY DEPARTMENT Provider Note   CSN: [id number] Arrival date & time: 10/26/19  0154     History   Chief Complaint Chief Complaint  Patient presents with  . Shortness of Breath  . Chest Pain    HPI Nicole Fry is a 34 y.o. female.     Patient to ED with complaint of wheezing, chest tightness, shortness of breath without fever that started yesterday. She reports a remote history of asthma but has not needed medications in over 5 years. She is a continuous smoker. No known sick contacts or known COVID exposures. No nausea, vomiting. She was given a nebulizer by EMS and states that helped her significantly and her chest tightness resolved with decreased wheezing.    Shortness of Breath Associated symptoms: cough and wheezing   Associated symptoms: no abdominal pain, no fever, no rash and no sore throat   Chest Pain Associated symptoms: cough and shortness of breath   Associated symptoms: no abdominal pain, no fever, no nausea and no weakness     Past Medical History:  Diagnosis Date  . Anemia   . Asthma   . Depression   . Headache(784.0)   . Hypertension   . Pneumonia     There are no active problems to display for this patient.   Past Surgical History:  Procedure Laterality Date  . BREAST BIOPSY  06/03/2013  . DILATION AND CURETTAGE OF UTERUS       OB History    Gravida  6   Para  3   Term  3   Preterm      AB  3   Living  3     SAB  1   TAB  2   Ectopic      Multiple      Live Births  3            Home Medications    Prior to Admission medications   Medication Sig Start Date End Date Taking? Authorizing Provider  escitalopram (LEXAPRO) 10 MG tablet Take 1 tablet (10 mg total) by mouth at bedtime. 06/17/19   Scot Jun, FNP    Family History Family History  Problem Relation Age of Onset  . Hypertension Mother   . Hypertension Maternal Grandmother     Social History Social History   Tobacco Use  . Smoking status: Current Every Day Smoker    Types: Cigarettes  . Smokeless tobacco: Never Used  . Tobacco comment: 7-9 cigarettes  Substance Use Topics  . Alcohol use: Yes  . Drug use: No     Allergies   Shellfish allergy   Review of Systems Review of Systems  Constitutional: Negative for fever.  HENT: Negative for congestion and sore throat.   Respiratory: Positive for cough, chest tightness, shortness of breath and wheezing.   Gastrointestinal: Negative for abdominal pain and nausea.  Musculoskeletal: Negative for myalgias.  Skin: Negative for rash.  Neurological: Negative for weakness and light-headedness.     Physical Exam Updated Vital Signs BP 126/90 (BP Location: Right Arm)   Pulse (!) 102   Temp 98.2 F (36.8 C) (Oral)   Resp (!) 22   LMP 10/05/2019   SpO2 98%   Physical Exam Vitals signs and nursing note reviewed.  Constitutional:      General: She is not in acute distress.    Appearance: She is well-developed. She is not toxic-appearing.  Cardiovascular:     Rate and Rhythm:  Normal rate and regular rhythm.     Heart sounds: No murmur.  Pulmonary:     Breath sounds: Wheezing and rhonchi present.     Comments: Prolonged expirations, decreased air movement. Actively coughing. Abdominal:     Palpations: Abdomen is soft.     Tenderness: There is no abdominal tenderness.  Skin:    General: Skin is warm and dry.  Neurological:     Mental Status: She is alert and oriented to person, place, and time.      ED Treatments / Results  Labs (all labs ordered are listed, but only abnormal results are displayed) Labs Reviewed  BASIC METABOLIC PANEL - Abnormal; Notable for the following components:      Result Value   Creatinine, Ser 1.06 (*)    All other components within normal limits  CBC - Abnormal; Notable for the following components:   Hemoglobin 15.1 (*)    MCV 101.8 (*)    MCH 34.8 (*)    All other components within normal limits   URINALYSIS, ROUTINE W REFLEX MICROSCOPIC - Abnormal; Notable for the following components:   Specific Gravity, Urine 1.004 (*)    Hgb urine dipstick SMALL (*)    Ketones, ur 5 (*)    Leukocytes,Ua LARGE (*)    Bacteria, UA RARE (*)    All other components within normal limits  I-STAT BETA HCG BLOOD, ED ([facility], [facility], [facility] ONLY)  I-STAT BETA HCG BLOOD, ED ([facility], [facility], [facility] ONLY)  TROPONIN I (HIGH SENSITIVITY)  TROPONIN I (HIGH SENSITIVITY)    EKG EKG Interpretation  Date/Time:  Monday October 26 2019 02:02:28 EDT Ventricular Rate:  100 PR Interval:  128 QRS Duration: 86 QT Interval:  388 QTC Calculation: 500 R Axis:   85 Text Interpretation:  Sinus rhythm with Premature supraventricular complexes ST & T wave abnormality, consider inferior ischemia Abnormal ECG No old tracing to compare Confirmed by Dione Booze ([id number]) on 10/26/2019 2:15:45 AM   Radiology Dg Chest 2 View  Result Date: 10/26/2019 CLINICAL DATA:  Shortness of breath EXAM: CHEST - 2 VIEW COMPARISON:  None. FINDINGS: The lungs are hyperexpanded. There is no focal airspace consolidation or pulmonary edema. No pleural effusion or pneumothorax. There is a nodular opacity projecting over the left upper lobe. IMPRESSION: 1. No active cardiopulmonary disease. 2. Nodular opacity projecting over the left upper lobe. Chest CT recommended for further assessment. Electronically Signed   By: Deatra Robinson M.D.   On: 10/26/2019 02:56    Procedures Procedures (including critical care time)  Medications Ordered in ED Medications  sodium chloride flush (NS) 0.9 % injection 3 mL (has no administration in time range)  albuterol (VENTOLIN HFA) 108 (90 Base) MCG/ACT inhaler 8 puff (has no administration in time range)  dexamethasone (DECADRON) injection 10 mg (has no administration in time range)     Initial Impression / Assessment and Plan / ED Course  I have reviewed the triage vital signs and the nursing notes.  Pertinent labs & imaging  results that were available during my care of the patient were reviewed by me and considered in my medical decision making (see chart for details).        Patient to ED with significant wheezing, SOB, chest tightness x 1 day. Better after nebulizer with EMS. No fever.   On my assessment the patient states her breathing is back to where it was before the neb treatment with EMS. An 8-puff inhaler with Albuterol is ordered. CXR shows a  concerning nodular opacity LUL and recommends CT for further assessment.   CT pending at the time of sign out to [employer] IncMargaux Venter, [place]PA-C.   Patient voiced to nursing that she would like an STD check based on possible exposure but did not voice this to me. This should be addressed prior to discharge. If no symptoms, would consider medicating.   Final Clinical Impressions(s) / ED Diagnoses   Final diagnoses:  None   1. wheezing  ED Discharge Orders    None       Elpidio AnisUpstill, Corona Popovich, PA-C 10/26/19 [id number]    Dione BoozeGlick, David, MD 10/26/19 0730

## 2019-10-26 NOTE — ED Notes (Signed)
Patient verbalizes understanding of discharge instructions. Opportunity for questioning and answers were provided. Armband removed by staff, pt discharged from ED. Ambulated out to lobby  

## 2019-10-27 LAB — NOVEL CORONAVIRUS, NAA (HOSP ORDER, SEND-OUT TO REF LAB; TAT 18-24 HRS): SARS-CoV-2, NAA: NOT DETECTED

## 2019-10-28 DIAGNOSIS — F4310 Post-traumatic stress disorder, unspecified: Secondary | ICD-10-CM | POA: Diagnosis not present

## 2019-10-28 DIAGNOSIS — F331 Major depressive disorder, recurrent, moderate: Secondary | ICD-10-CM | POA: Diagnosis not present

## 2020-01-18 ENCOUNTER — Other Ambulatory Visit: Payer: Self-pay

## 2020-01-18 ENCOUNTER — Ambulatory Visit: Payer: Self-pay

## 2020-01-18 ENCOUNTER — Other Ambulatory Visit: Payer: Self-pay | Admitting: Family Medicine

## 2020-01-18 DIAGNOSIS — S20211A Contusion of right front wall of thorax, initial encounter: Secondary | ICD-10-CM

## 2020-01-21 IMAGING — CT CT CHEST W/O CM
2 of 3 series · 15 of 36 positions shown, 18 images · non-contrast
Comparison: Chest x-ray, same date.

CLINICAL DATA: Shortness of breath and chest pain. Asthma attack.
Followup abnormal chest x-ray.

EXAM:
CT CHEST WITHOUT CONTRAST
TECHNIQUE: Multidetector CT imaging of the chest was performed following the
standard protocol without IV contrast.

[Series 3: chest w/o 2mm st · axial · non-contrast · 0.62mm/px · z∈[-324,-26]mm · 12 of 175 slices shown, 15 images]
[im 13/175  mediastinal]
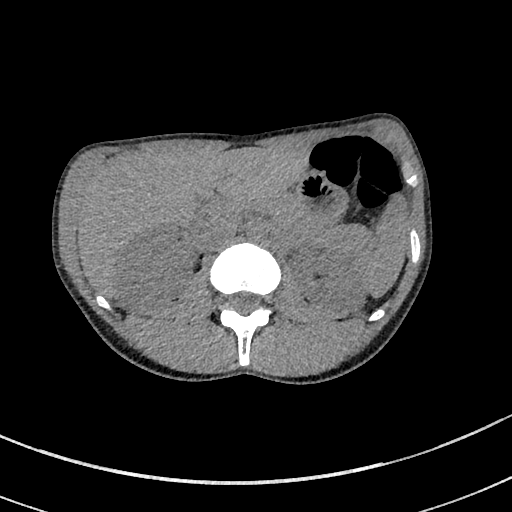
[im 13/175  lung]
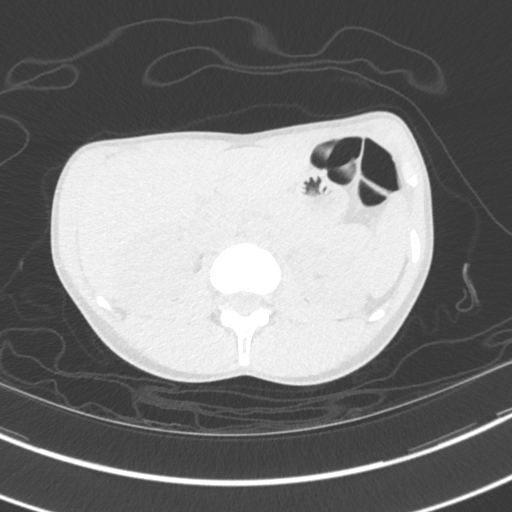
[im 26/175  lung]
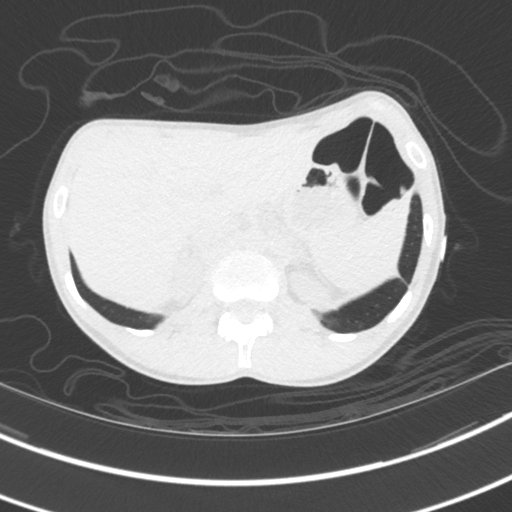
[im 39/175  lung]
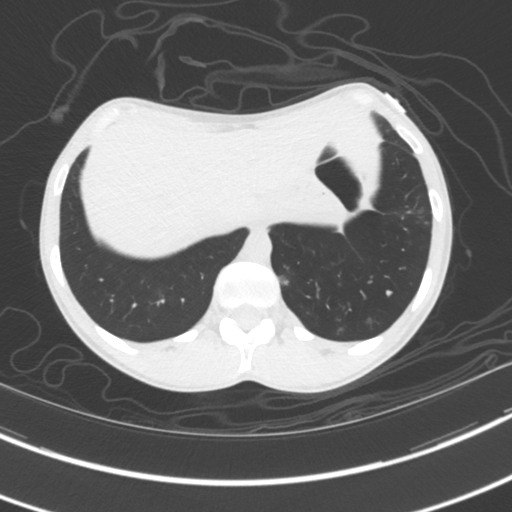
[im 52/175  lung]
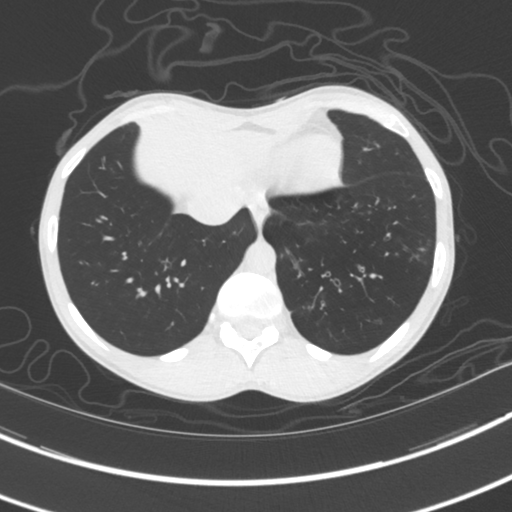
[im 65/175  mediastinal]
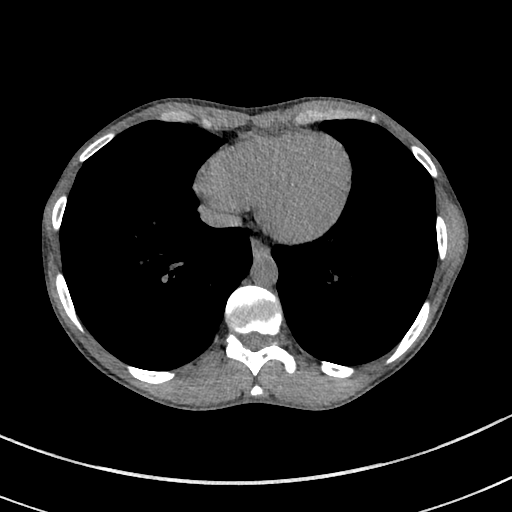
[im 65/175  lung]
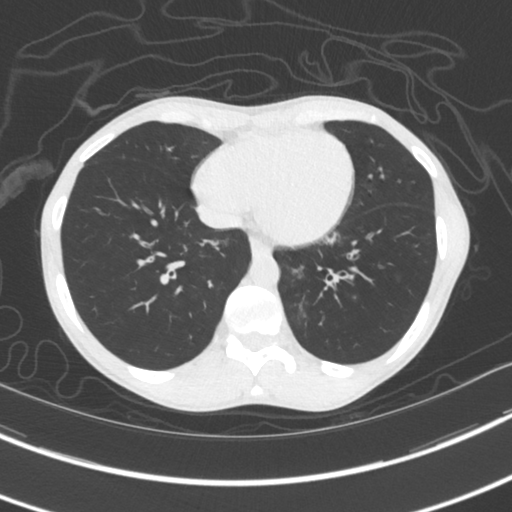
[im 78/175  lung]
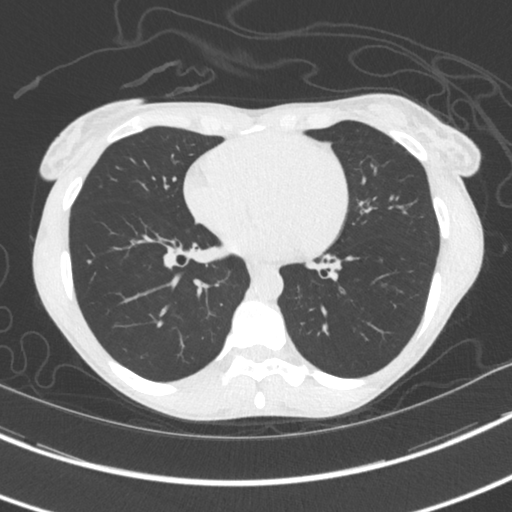
[im 97/175  lung]
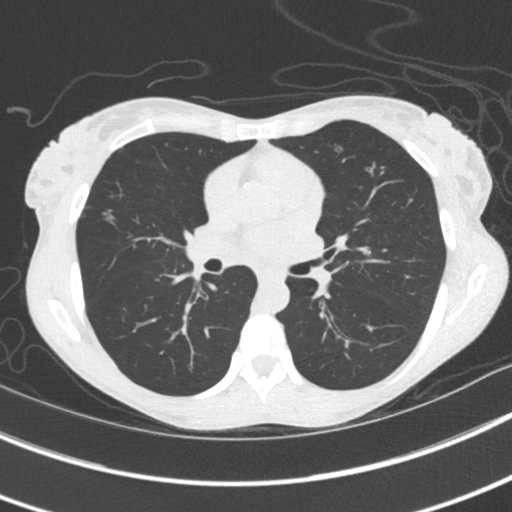
[im 110/175  lung]
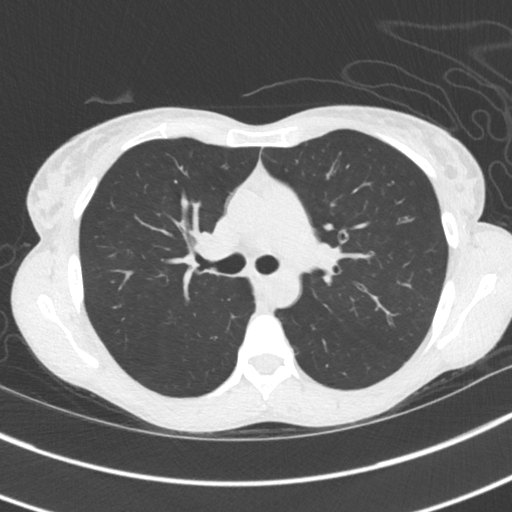
[im 123/175  mediastinal]
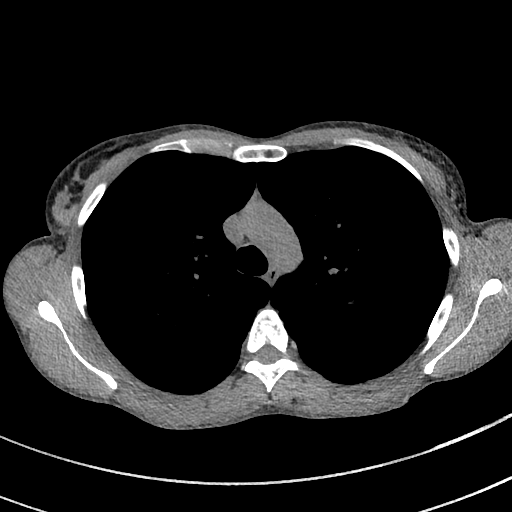
[im 123/175  lung]
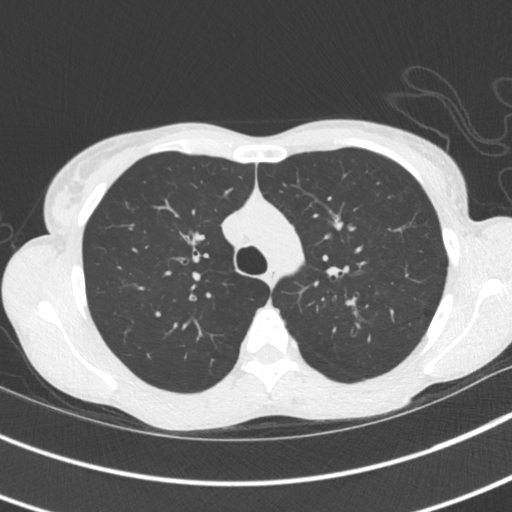
[im 136/175  lung]
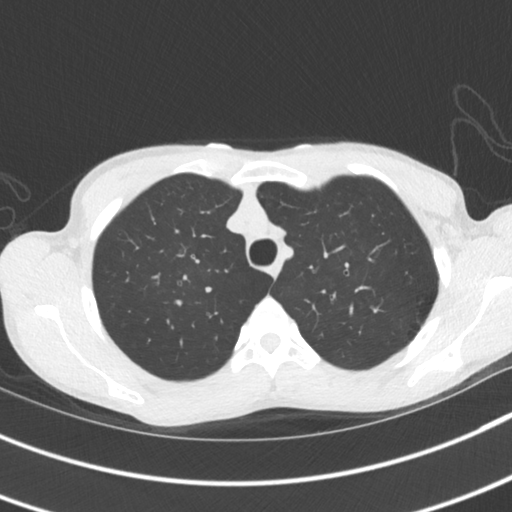
[im 149/175  lung]
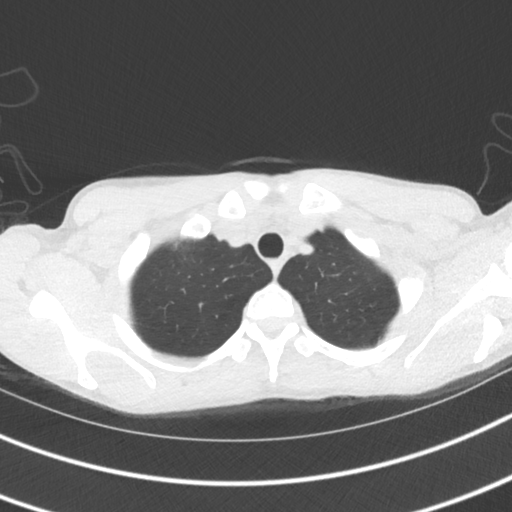
[im 162/175  lung]
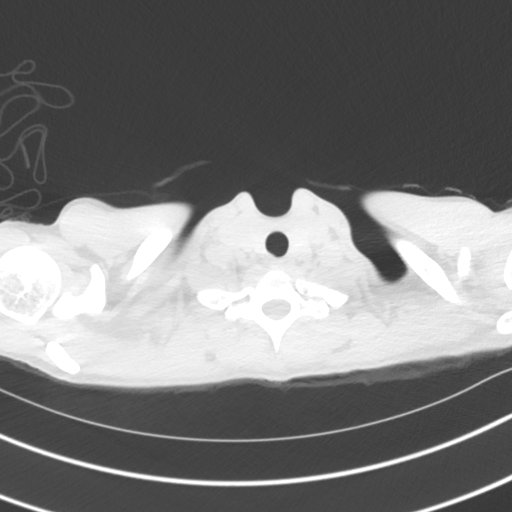

[Series 5: chest w/o 3mm st cor · coronal · non-contrast · 0.68mm/px · 3 of 77 slices shown]
[im 16/77  lung]
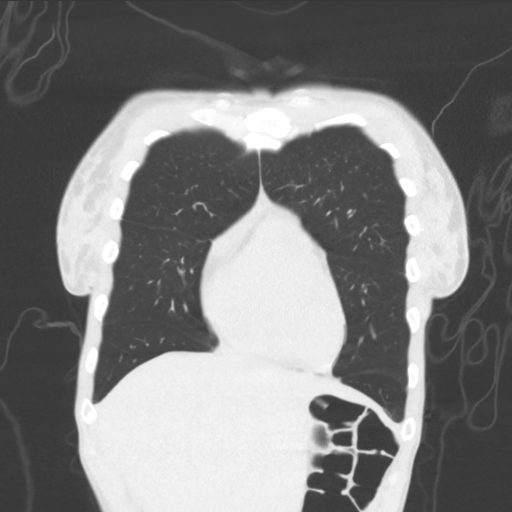
[im 31/77  lung]
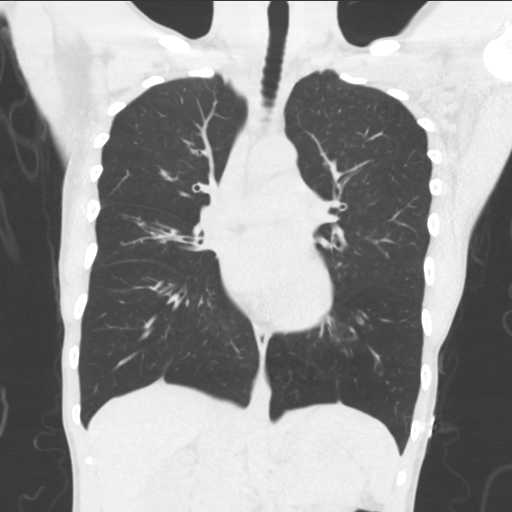
[im 46/77  lung]
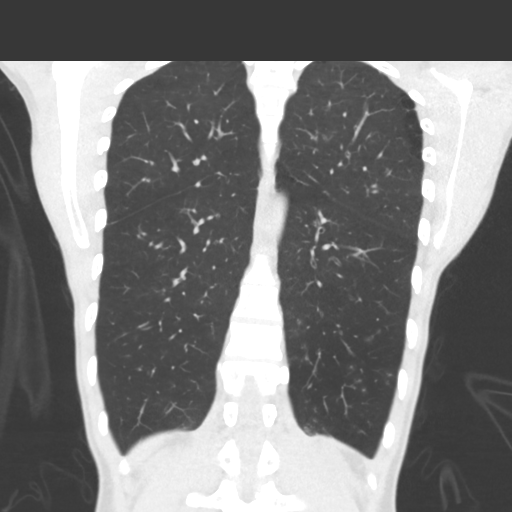

[15 of 36 positions shown; findings below may reference images not displayed]

FINDINGS: Cardiovascular: The heart is normal in size. No pericardial
effusion. The aorta is normal in caliber. No atherosclerotic
calcifications.

Mediastinum/Nodes: No mediastinal or hilar mass or adenopathy. The
esophagus is grossly normal.

Lungs/Pleura: Moderate hyperinflation is noted. There is bronchial
wall thickening and patchy areas of inflammation and tree-in-bud
appearance peripherally. Findings suggest bronchitis or reactive
airways disease. No focal airspace consolidation to suggest
pneumonia. No worrisome pulmonary lesions are identified.

Small nodular density along the left major fissures consistent with
a intrapulmonary lymph node.

No pleural effusions. A few areas of paraseptal emphysema are noted.

Upper Abdomen: No significant upper abdominal findings.

Musculoskeletal: Pectus deformity noted. Asymmetric nodular density
noted in the inferior aspect of the left breast medially with an
adjacent calcification. Recommend correlation with physical
examination. Patient may require ultrasound and possible diagnostic
mammogram for further evaluation.

No significant bony findings.
IMPRESSION: 1. Hyperinflation and bronchial wall thickening suggesting
bronchitis or reactive airways disease. There also inflammatory
changes with peripheral tree-in-bud changes predominantly in the
lower lung zones. No focal airspace consolidation/pneumonia.
2. Early changes of paraseptal emphysema.
3. No worrisome pulmonary lesions.
4. **An incidental finding of potential clinical significance has
been found. Asymmetric 2 cm density in the inferior and medial
aspect of the left breast. Recommend correlation with physical
examination. Patient may require ultrasound and diagnostic
mammogram.**

Emphysema ([id number]).

## 2020-01-21 IMAGING — DX DG CHEST 2V
2 series · 2 of 2 positions shown · non-contrast
Comparison: None.

CLINICAL DATA: Shortness of breath

EXAM:
CHEST - 2 VIEW

[chest pa]
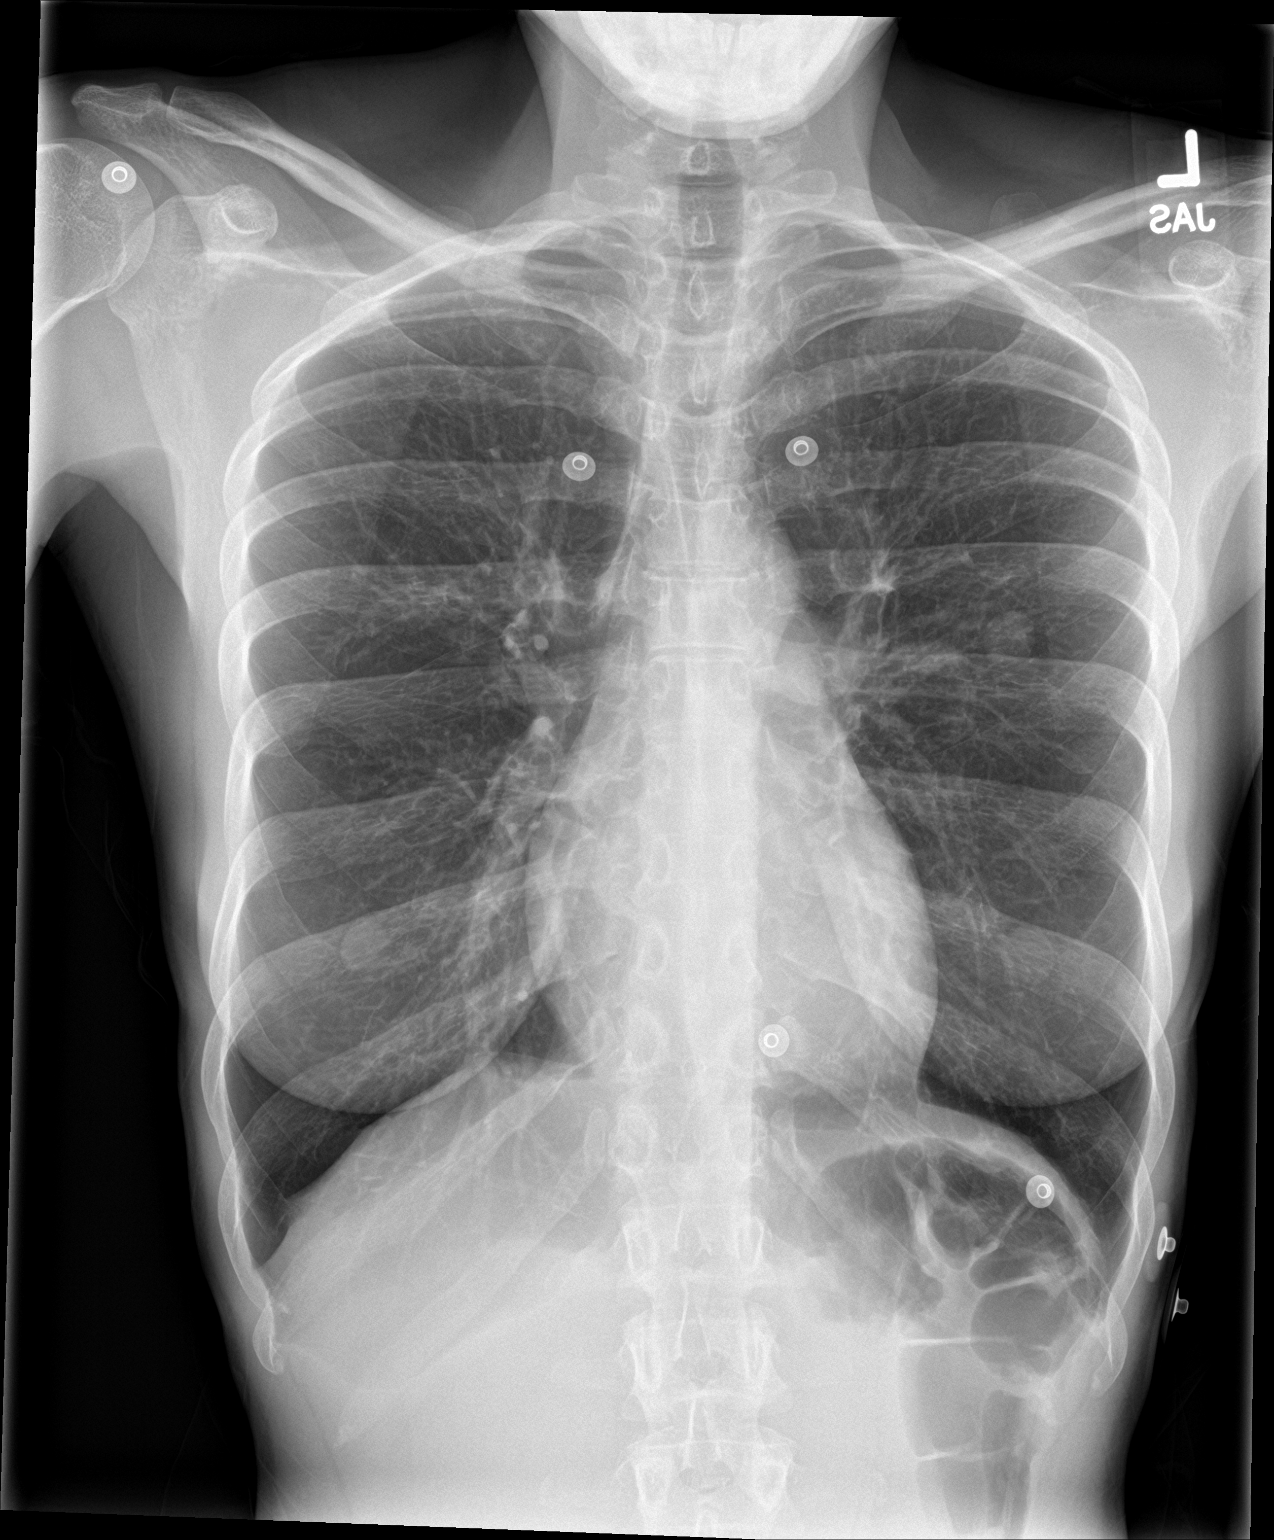

[chest lat]
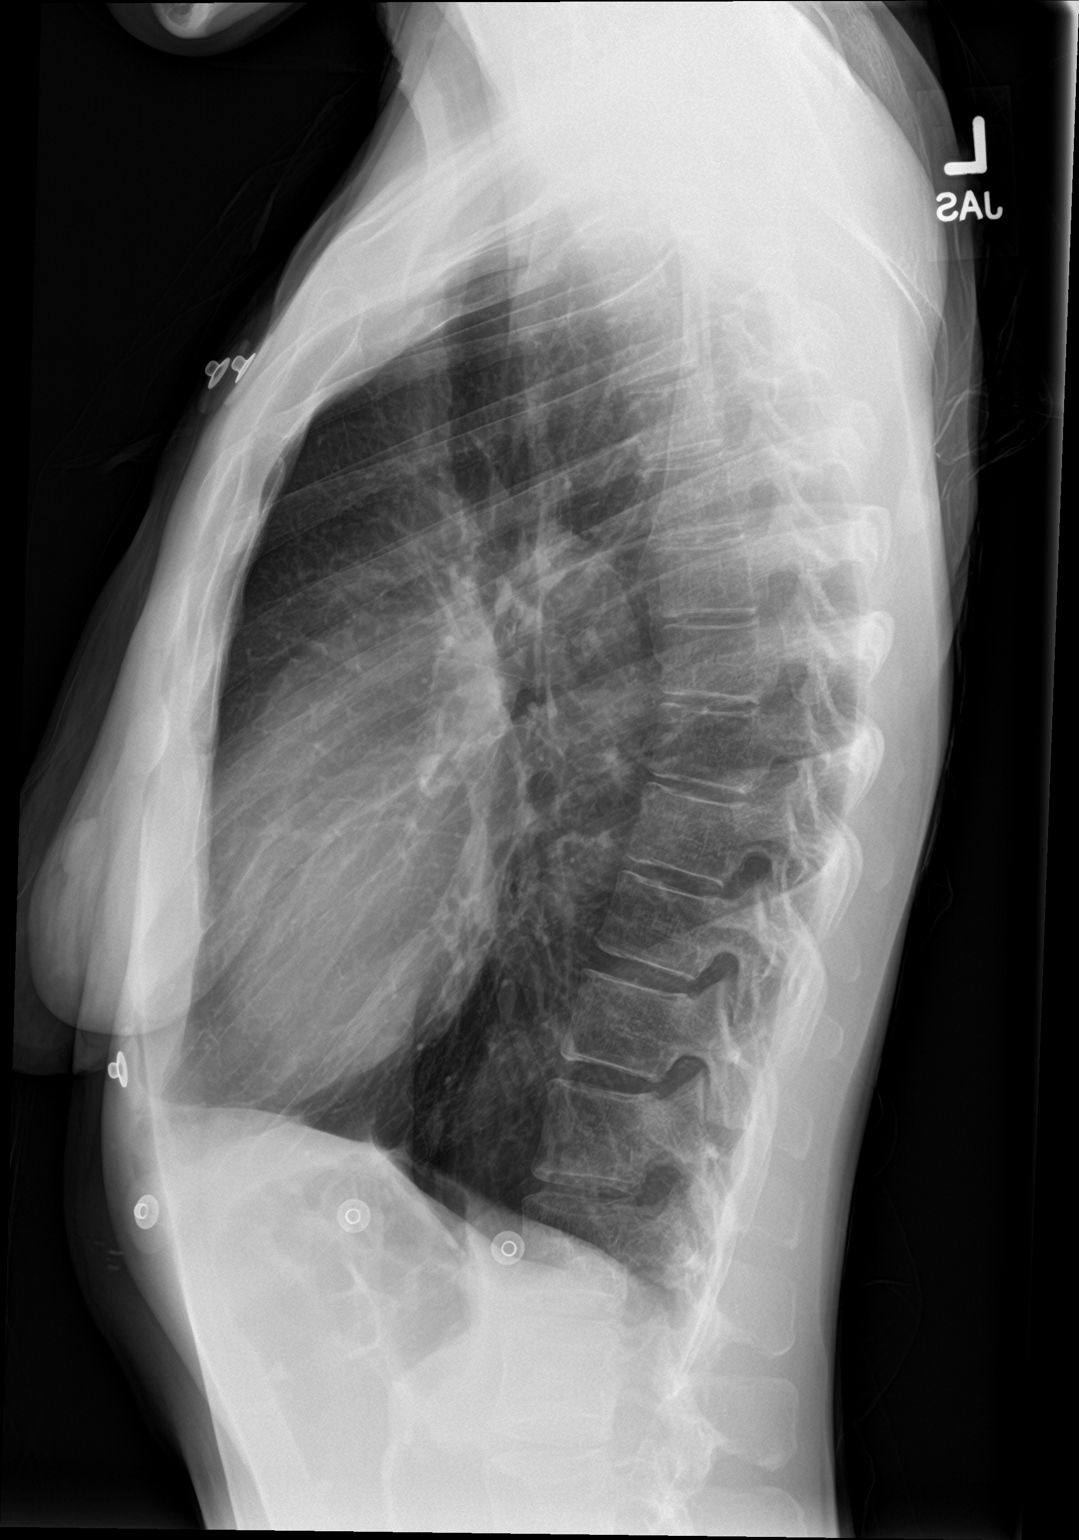

[2 of 2 positions shown; findings below may reference images not displayed]

FINDINGS: The lungs are hyperexpanded. There is no focal airspace
consolidation or pulmonary edema. No pleural effusion or
pneumothorax. There is a nodular opacity projecting over the left
upper lobe.
IMPRESSION: 1. No active cardiopulmonary disease.
2. Nodular opacity projecting over the left upper lobe. Chest CT
recommended for further assessment.

## 2020-02-03 DIAGNOSIS — F331 Major depressive disorder, recurrent, moderate: Secondary | ICD-10-CM | POA: Diagnosis not present

## 2020-02-03 DIAGNOSIS — F4310 Post-traumatic stress disorder, unspecified: Secondary | ICD-10-CM | POA: Diagnosis not present

## 2020-02-04 DIAGNOSIS — F4310 Post-traumatic stress disorder, unspecified: Secondary | ICD-10-CM | POA: Diagnosis not present

## 2020-02-04 DIAGNOSIS — F331 Major depressive disorder, recurrent, moderate: Secondary | ICD-10-CM | POA: Diagnosis not present

## 2020-02-15 DIAGNOSIS — F331 Major depressive disorder, recurrent, moderate: Secondary | ICD-10-CM | POA: Diagnosis not present

## 2020-02-15 DIAGNOSIS — F4310 Post-traumatic stress disorder, unspecified: Secondary | ICD-10-CM | POA: Diagnosis not present

## 2020-02-18 DIAGNOSIS — F331 Major depressive disorder, recurrent, moderate: Secondary | ICD-10-CM | POA: Diagnosis not present

## 2020-02-18 DIAGNOSIS — F4310 Post-traumatic stress disorder, unspecified: Secondary | ICD-10-CM | POA: Diagnosis not present

## 2020-02-28 ENCOUNTER — Other Ambulatory Visit: Payer: Self-pay

## 2020-02-28 ENCOUNTER — Emergency Department (HOSPITAL_COMMUNITY)
Admission: EM | Admit: 2020-02-28 | Discharge: 2020-02-28 | Disposition: A | Discharge status: Home / Self Care | Payer: Medicaid Other | Attending: Emergency Medicine | Admitting: Emergency Medicine

## 2020-02-28 ENCOUNTER — Encounter (HOSPITAL_COMMUNITY): Payer: Self-pay | Admitting: Emergency Medicine

## 2020-02-28 ENCOUNTER — Emergency Department (HOSPITAL_COMMUNITY): Payer: Medicaid Other

## 2020-02-28 DIAGNOSIS — Z20822 Contact with and (suspected) exposure to covid-19: Secondary | ICD-10-CM | POA: Diagnosis not present

## 2020-02-28 DIAGNOSIS — B9689 Other specified bacterial agents as the cause of diseases classified elsewhere: Secondary | ICD-10-CM | POA: Diagnosis not present

## 2020-02-28 DIAGNOSIS — N760 Acute vaginitis: Secondary | ICD-10-CM | POA: Diagnosis not present

## 2020-02-28 DIAGNOSIS — N12 Tubulo-interstitial nephritis, not specified as acute or chronic: Secondary | ICD-10-CM | POA: Diagnosis not present

## 2020-02-28 DIAGNOSIS — R519 Headache, unspecified: Secondary | ICD-10-CM | POA: Diagnosis not present

## 2020-02-28 DIAGNOSIS — Z79899 Other long term (current) drug therapy: Secondary | ICD-10-CM | POA: Diagnosis not present

## 2020-02-28 DIAGNOSIS — F17210 Nicotine dependence, cigarettes, uncomplicated: Secondary | ICD-10-CM | POA: Diagnosis not present

## 2020-02-28 DIAGNOSIS — A5901 Trichomonal vulvovaginitis: Secondary | ICD-10-CM | POA: Diagnosis not present

## 2020-02-28 DIAGNOSIS — M7918 Myalgia, other site: Secondary | ICD-10-CM | POA: Diagnosis not present

## 2020-02-28 DIAGNOSIS — R109 Unspecified abdominal pain: Secondary | ICD-10-CM | POA: Diagnosis present

## 2020-02-28 DIAGNOSIS — R112 Nausea with vomiting, unspecified: Secondary | ICD-10-CM | POA: Diagnosis not present

## 2020-02-28 DIAGNOSIS — I10 Essential (primary) hypertension: Secondary | ICD-10-CM | POA: Diagnosis not present

## 2020-02-28 DIAGNOSIS — E860 Dehydration: Secondary | ICD-10-CM | POA: Diagnosis not present

## 2020-02-28 DIAGNOSIS — F1210 Cannabis abuse, uncomplicated: Secondary | ICD-10-CM | POA: Diagnosis not present

## 2020-02-28 DIAGNOSIS — J45909 Unspecified asthma, uncomplicated: Secondary | ICD-10-CM | POA: Diagnosis not present

## 2020-02-28 DIAGNOSIS — R1031 Right lower quadrant pain: Secondary | ICD-10-CM | POA: Diagnosis not present

## 2020-02-28 DIAGNOSIS — R102 Pelvic and perineal pain: Secondary | ICD-10-CM | POA: Insufficient documentation

## 2020-02-28 LAB — URINALYSIS, ROUTINE W REFLEX MICROSCOPIC
Bilirubin Urine: NEGATIVE
Glucose, UA: NEGATIVE mg/dL
Ketones, ur: 80 mg/dL — AB
Nitrite: POSITIVE — AB
Protein, ur: 100 mg/dL — AB
Specific Gravity, Urine: 1.016 (ref 1.005–1.030)
WBC, UA: >50 WBC/hpf — ABNORMAL HIGH (ref 0–5)
pH: 6 (ref 5.0–8.0)

## 2020-02-28 LAB — CBC
HCT: 40.6 % (ref 36.0–46.0)
Hemoglobin: 13.5 g/dL (ref 12.0–15.0)
MCH: 34.1 pg — ABNORMAL HIGH (ref 26.0–34.0)
MCHC: 33.3 g/dL (ref 30.0–36.0)
MCV: 102.5 fL — ABNORMAL HIGH (ref 80.0–100.0)
Platelets: 252 10*3/uL (ref 150–400)
RBC: 3.96 MIL/uL (ref 3.87–5.11)
RDW: 14.1 % (ref 11.5–15.5)
WBC: 11.3 10*3/uL — ABNORMAL HIGH (ref 4.0–10.5)
nRBC: 0 % (ref 0.0–0.2)

## 2020-02-28 LAB — COMPREHENSIVE METABOLIC PANEL
ALT: 13 U/L (ref 0–44)
AST: 20 U/L (ref 15–41)
Albumin: 3.4 g/dL — ABNORMAL LOW (ref 3.5–5.0)
Alkaline Phosphatase: 62 U/L (ref 38–126)
Anion gap: 14 (ref 5–15)
BUN: 8 mg/dL (ref 6–20)
CO2: 20 mmol/L — ABNORMAL LOW (ref 22–32)
Calcium: 8.9 mg/dL (ref 8.9–10.3)
Chloride: 101 mmol/L (ref 98–111)
Creatinine, Ser: 1.04 mg/dL — ABNORMAL HIGH (ref 0.44–1.00)
GFR calc Af Amer: >60 mL/min (ref >60)
GFR calc non Af Amer: >60 mL/min (ref >60)
Glucose, Bld: 98 mg/dL (ref 70–99)
Potassium: 3.7 mmol/L (ref 3.5–5.1)
Sodium: 135 mmol/L (ref 135–145)
Total Bilirubin: 1.7 mg/dL — ABNORMAL HIGH (ref 0.3–1.2)
Total Protein: 7.3 g/dL (ref 6.5–8.1)

## 2020-02-28 LAB — I-STAT BETA HCG BLOOD, ED (MC, WL, AP ONLY): I-stat hCG, quantitative: <5 m[IU]/mL (ref <5)

## 2020-02-28 LAB — LIPASE, BLOOD: Lipase: 18 U/L (ref 11–51)

## 2020-02-28 LAB — WET PREP, GENITAL
Sperm: NONE SEEN
Yeast Wet Prep HPF POC: NONE SEEN

## 2020-02-28 MED ORDER — SODIUM CHLORIDE 0.9 % IV SOLN
1.0000 g | Freq: Once | INTRAVENOUS | Status: AC
  Administered 2020-02-28: 22:00:00 1 g via INTRAVENOUS
  Filled 2020-02-28: qty 10

## 2020-02-28 MED ORDER — METOCLOPRAMIDE HCL 5 MG/ML IJ SOLN
10.0000 mg | Freq: Once | INTRAMUSCULAR | Status: AC
  Administered 2020-02-28: 19:00:00 10 mg via INTRAVENOUS
  Filled 2020-02-28: qty 2

## 2020-02-28 MED ORDER — METRONIDAZOLE 500 MG PO TABS: 500.0000 mg | ORAL_TABLET | Freq: Two times a day (BID) | ORAL | 0 refills | Status: AC

## 2020-02-28 MED ORDER — HYDROMORPHONE HCL 1 MG/ML IJ SOLN
0.5000 mg | Freq: Once | INTRAMUSCULAR | Status: AC
  Administered 2020-02-28: 19:00:00 0.5 mg via INTRAVENOUS
  Filled 2020-02-28: qty 1

## 2020-02-28 MED ORDER — ONDANSETRON 4 MG PO TBDP: 4.0000 mg | ORAL_TABLET | Freq: Three times a day (TID) | ORAL | 0 refills | Status: AC | PRN

## 2020-02-28 MED ORDER — SODIUM CHLORIDE 0.9% FLUSH: 3.0000 mL | Freq: Once | INTRAVENOUS | Status: DC

## 2020-02-28 MED ORDER — SODIUM CHLORIDE 0.9 % IV BOLUS
1000.0000 mL | Freq: Once | INTRAVENOUS | Status: AC
  Administered 2020-02-28: 19:00:00 1000 mL via INTRAVENOUS

## 2020-02-28 MED ORDER — SODIUM CHLORIDE 0.9 % IV SOLN
25.0000 mg | INTRAVENOUS | Status: DC | PRN
  Filled 2020-02-28: qty 0.5

## 2020-02-28 MED ORDER — KETOROLAC TROMETHAMINE 30 MG/ML IJ SOLN
15.0000 mg | Freq: Once | INTRAMUSCULAR | Status: AC
  Administered 2020-02-28: 19:00:00 15 mg via INTRAVENOUS
  Filled 2020-02-28: qty 1

## 2020-02-28 MED ORDER — SODIUM CHLORIDE 0.9 % IV BOLUS
1000.0000 mL | Freq: Once | INTRAVENOUS | Status: AC
  Administered 2020-02-28: 22:00:00 1000 mL via INTRAVENOUS

## 2020-02-28 MED ORDER — CEPHALEXIN 500 MG PO CAPS: 500.0000 mg | ORAL_CAPSULE | Freq: Four times a day (QID) | ORAL | 0 refills | Status: AC

## 2020-02-28 MED ORDER — METRONIDAZOLE 500 MG PO TABS
500.0000 mg | ORAL_TABLET | Freq: Once | ORAL | Status: AC
  Administered 2020-02-28: 22:00:00 500 mg via ORAL
  Filled 2020-02-28: qty 1

## 2020-02-28 NOTE — ED Provider Notes (Signed)
[facility] EMERGENCY DEPARTMENT Provider Note   CSN: [id number] Arrival date & time: 02/28/20  1755     History Chief Complaint  Patient presents with  . Back Pain    right side    Nicole Fry is a 35 y.o. female.  HPI  Patient is a 35 year old female with a history of asthma, depression, headaches, hypertension presented today with right flank pain has been waxing and waning but progressively worsening over the past 1 week.  It is right-sided and seems to radiate to her pelvis.  She states it is worse with walking and deep breathing. She states that over the past 2 days she has vomited twice per day and felt nauseous intermittently as well.  She states vomit is nonbloody nonbilious.  She also endorses a associated headache which she states is "all over ".  She also states that she has generalized body aches.   She states that her pain is 10/10.  She states she has been taking six ibuprofens per day with no relief at all.  She states she is however able to sleep after she takes Tylenol.  She endorses cloudy urine that is malodorous and states that she has been peeing more frequently as well.  She also endorses some diarrhea over the past 2 days although she states that 2 days prior to that she had hard bowel movements that were difficult to push out.  She states that her last menstrual period ended this morning.    Past Medical History:  Diagnosis Date  . Anemia   . Asthma   . Depression   . Headache(784.0)   . Hypertension   . Pneumonia     There are no problems to display for this patient.   Past Surgical History:  Procedure Laterality Date  . BREAST BIOPSY  06/03/2013  . DILATION AND CURETTAGE OF UTERUS       OB History    Gravida  6   Para  3   Term  3   Preterm      AB  3   Living  3     SAB  1   TAB  2   Ectopic      Multiple      Live Births  3           Family History  Problem Relation Age of Onset  . Hypertension  Mother   . Hypertension Maternal Grandmother     Social History   Tobacco Use  . Smoking status: Current Every Day Smoker    Types: Cigarettes  . Smokeless tobacco: Never Used  . Tobacco comment: 7-9 cigarettes  Substance Use Topics  . Alcohol use: Yes  . Drug use: Yes    Frequency: 5.0 times per week    Types: Marijuana    Home Medications Prior to Admission medications   Medication Sig Start Date End Date Taking? Authorizing Provider  cephALEXin (KEFLEX) 500 MG capsule Take 1 capsule (500 mg total) by mouth 4 (four) times daily for 14 days. 02/28/20 03/13/20  Gailen Shelter, PA  escitalopram (LEXAPRO) 10 MG tablet Take 1 tablet (10 mg total) by mouth at bedtime. 06/17/19   Bing Neighbors, FNP  metroNIDAZOLE (FLAGYL) 500 MG tablet Take 1 tablet (500 mg total) by mouth 2 (two) times daily. 02/28/20   Gailen Shelter, PA  ondansetron (ZOFRAN ODT) 4 MG disintegrating tablet Take 1 tablet (4 mg total) by mouth every 8 (eight) hours  as needed for nausea or vomiting. 02/28/20   Gailen Shelter, PA    Allergies    Shellfish allergy  Review of Systems   Review of Systems  Constitutional: Positive for chills and fatigue. Negative for fever.  HENT: Negative for congestion.   Eyes: Negative for pain.  Respiratory: Negative for cough and shortness of breath.   Cardiovascular: Negative for chest pain and leg swelling.  Gastrointestinal: Positive for abdominal pain, constipation, diarrhea, nausea and vomiting.  Genitourinary: Positive for frequency. Negative for dysuria.  Musculoskeletal: Positive for myalgias.  Skin: Negative for rash.  Neurological: Negative for dizziness and headaches.    Physical Exam Updated Vital Signs BP 123/85   Pulse 85   Temp 99.6 F (37.6 C) (Oral)   Resp 20   Ht 5\' 3"  (1.6 m)   Wt 49.9 kg   LMP 01/28/2020 (Approximate)   SpO2 100%   BMI 19.49 kg/m   Physical Exam Vitals and nursing note reviewed. Exam conducted with a chaperone present.   Constitutional:      General: She is in acute distress.     Appearance: She is not ill-appearing.  HENT:     Head: Normocephalic and atraumatic.     Nose: Nose normal.     Mouth/Throat:     Mouth: Mucous membranes are dry.     Comments: Patient with dry oral mucosa Eyes:     General: No scleral icterus. Cardiovascular:     Rate and Rhythm: Normal rate and regular rhythm.     Pulses: Normal pulses.     Heart sounds: Normal heart sounds.  Pulmonary:     Effort: Pulmonary effort is normal. No respiratory distress.     Breath sounds: No wheezing.  Abdominal:     Palpations: Abdomen is soft.     Tenderness: There is abdominal tenderness (Right lower quadrant tenderness and right flank tenderness). There is right CVA tenderness. There is no left CVA tenderness, guarding or rebound.  Genitourinary:    Comments: Vulva without lesions or abnormality Vaginal canal with copious brown vaginal discharge Cervix appears normal, is closed No adnexal tenderness or CMT  Musculoskeletal:     Cervical back: Normal range of motion.     Right lower leg: No edema.     Left lower leg: No edema.  Skin:    General: Skin is warm and dry.     Capillary Refill: Capillary refill takes less than 2 seconds.  Neurological:     Mental Status: She is alert. Mental status is at baseline.  Psychiatric:        Mood and Affect: Mood normal.        Behavior: Behavior normal.     ED Results / Procedures / Treatments   Labs (all labs ordered are listed, but only abnormal results are displayed) Labs Reviewed  WET PREP, GENITAL - Abnormal; Notable for the following components:      Result Value   Trich, Wet Prep PRESENT (*)    Clue Cells Wet Prep HPF POC PRESENT (*)    WBC, Wet Prep HPF POC FEW (*)    All other components within normal limits  COMPREHENSIVE METABOLIC PANEL - Abnormal; Notable for the following components:   CO2 20 (*)    Creatinine, Ser 1.04 (*)    Albumin 3.4 (*)    Total Bilirubin 1.7  (*)    All other components within normal limits  CBC - Abnormal; Notable for the following components:   WBC  11.3 (*)    MCV 102.5 (*)    MCH 34.1 (*)    All other components within normal limits  URINALYSIS, ROUTINE W REFLEX MICROSCOPIC - Abnormal; Notable for the following components:   APPearance CLOUDY (*)    Hgb urine dipstick SMALL (*)    Ketones, ur 80 (*)    Protein, ur 100 (*)    Nitrite POSITIVE (*)    Leukocytes,Ua LARGE (*)    WBC, UA >50 (*)    Bacteria, UA MANY (*)    Non Squamous Epithelial 0-5 (*)    All other components within normal limits  SARS CORONAVIRUS 2 (TAT 6-24 HRS)  URINE CULTURE  LIPASE, BLOOD  I-STAT BETA HCG BLOOD, ED ([facility], [facility], [facility] ONLY)  GC/CHLAMYDIA PROBE AMP ([place]) NOT AT [facility]    EKG None  Radiology CT ABDOMEN PELVIS WO CONTRAST  Result Date: 02/28/2020 CLINICAL DATA:  Right lower quadrant abdominal pain and flank pain. Renal stone disease versus appendicitis. EXAM: CT ABDOMEN AND PELVIS WITHOUT CONTRAST TECHNIQUE: Multidetector CT imaging of the abdomen and pelvis was performed following the standard protocol without IV contrast. COMPARISON:  None. FINDINGS: Lower chest: Normal Hepatobiliary: Renal parenchyma appears normal. No calcified gallstones. Pancreas: Normal Spleen: Normal Adrenals/Urinary Tract: No adrenal lesion is seen. Kidneys appear normal in size and position. No renal calculi. No stones seen along the course of either ureter. No stone in the bladder. Few phleboliths in the pelvis. Stomach/Bowel: The appendix is normal. No other visible bowel pathology. Vascular/Lymphatic: No abnormal vascular finding. Virtually absent retroperitoneal fat. Reproductive: No pelvic mass. Other: No free fluid or air. Musculoskeletal: Normal IMPRESSION: Normal appearing appendix. No evidence of urinary tract pathology. No sign of other bowel pathology or solid organ pathology. There is a paucity of fat. Electronically Signed   By: Nelson Chimes M.D.    On: 02/28/2020 21:10    Procedures Procedures (including critical care time)  Medications Ordered in ED Medications  sodium chloride flush (NS) 0.9 % injection 3 mL (3 mLs Intravenous Not Given 02/28/20 1902)  diphenhydrAMINE (BENADRYL) 25 mg in sodium chloride 0.9 % 50 mL IVPB (has no administration in time range)  metoCLOPramide (REGLAN) injection 10 mg (10 mg Intravenous Given 02/28/20 1858)  sodium chloride 0.9 % bolus 1,000 mL ( Intravenous Stopped 02/28/20 2005)  HYDROmorphone (DILAUDID) injection 0.5 mg (0.5 mg Intravenous Given 02/28/20 1855)  ketorolac (TORADOL) 30 MG/ML injection 15 mg (15 mg Intravenous Given 02/28/20 1859)  cefTRIAXone (ROCEPHIN) 1 g in sodium chloride 0.9 % 100 mL IVPB (1 g Intravenous New Bag/Given 02/28/20 2132)  sodium chloride 0.9 % bolus 1,000 mL (1,000 mLs Intravenous New Bag/Given 02/28/20 2130)  metroNIDAZOLE (FLAGYL) tablet 500 mg (500 mg Oral Given 02/28/20 2130)    ED Course  I have reviewed the triage vital signs and the nursing notes.  Pertinent labs & imaging results that were available during my care of the patient were reviewed by me and considered in my medical decision making (see chart for details).  Clinical Course as of Feb 27 2233  Nancy Fetter Feb 28, 2020  2226 Wet prep significant for trichomonas, clue cells and white cells.  This is consistent with sexually transmitted trichomonas infection as well as bacterial vaginosis.  Patient has both infections will treat with Flagyl for 1 week twice daily.  She is given first dose today.   [WF]  2226 Urinalysis with positive nitrates, leukocytes, many bacteria and appears to be a clean sample.  She has  CVA tenderness and is febrile this most likely pyelonephritis.  Will provide patient with 1 g of Rocephin in ED along with normal saline and discharged with Keflex for 14 days.   [WF]  2228 hCG is negative.  Doubt ectopic pregnancy.  Additionally she had no adnexal tenderness.  Doubt ovarian cyst or PID.   Patient had no CMT.   [WF]  2228 CMP relatively unremarkable.  Mildly elevated bilirubin.  She has no prior labs for comparison.  She has no right upper quadrant tenderness.  Doubt biliary disease as there is no cholelithiasis on CT imaging or any evidence of biliary dilation or obstruction.  She has negative Eulah Pont sign and is not jaundiced.  Comprehensive metabolic panel(!) [WF]  2229 Mild leukocytosis likely secondary to pyelonephritis or from the marginalization secondary to her vomiting.  No anemia.  CBC(!) [WF]  2229 Urine culture pending, GC chlamydia pending, Covid test pending.   [WF]  2230 Lipase within normal limits   [WF]  2230 Patient's abdominal pain is completely subsided.  She is no longer febrile has no tachycardia, satting under percent on room air and feels much improved.  She states that she is comfortable with being discharged home with Keflex and Flagyl.  She understands need for follow-up with her PCP in the next week or 2.  She will return to ED if she has any new or concerning symptoms.  I discussed with her her slightly elevated bilirubin she is understanding of this and will have it rechecked.   [WF]    Clinical Course User Index [WF] Gailen Shelter, [place]   MDM Rules/Calculators/A&P                      Patient discharged with Keflex and Flagyl.  Her discharge diagnoses were pyelonephritis and BV/trichomonas.  She was counseled on STDs and need for abstinence for 2 weeks and treatment of herself and her partner.  She is understanding of this.   She will have her bilirubin rechecked at her PCP office.  She will return to ED if she has any new or concerning symptoms.    Nicole Fry was evaluated in Emergency Department on 02/28/2020 for the symptoms described in the history of present illness. She was evaluated in the context of the global COVID-19 pandemic, which necessitated consideration that the patient might be at risk for infection with the SARS-CoV-2 virus  that causes COVID-19. Institutional protocols and algorithms that pertain to the evaluation of patients at risk for COVID-19 are in a state of rapid change based on information released by regulatory bodies including the CDC and federal and state organizations. These policies and algorithms were followed during the patient's care in the ED.  The medical records were personally reviewed by myself. I personally reviewed all lab results and interpreted all imaging studies and either concurred with their official read or contacted radiology for clarification.   This patient appears reasonably screened and I doubt any other medical condition requiring further workup, evaluation, or treatment in the ED at this time prior to discharge.   Patient's vitals are WNL apart from vital sign abnormalities discussed above, patient is in NAD, and able to ambulate in the ED at their baseline and able to tolerate PO.  Pain has been managed or a plan has been made for home management and has no complaints prior to discharge. Patient is comfortable with above plan and for discharge at this time. All questions were answered prior  to disposition. Results from the ER workup discussed with the patient face to face and all questions answered to the best of my ability. The patient is safe for discharge with strict return precautions. Patient appears safe for discharge with appropriate follow-up. Conveyed my impression with the patient and they voiced understanding and are agreeable to plan.   An After Visit Summary was printed and given to the patient.  Portions of this note were generated with Scientist, clinical (histocompatibility and immunogenetics). Dictation errors may occur despite best attempts at proofreading.    Final Clinical Impression(s) / ED Diagnoses Final diagnoses:  Pyelonephritis  Bacterial vaginitis  Trichomonas vaginalis (TV) infection  Nonintractable headache, unspecified chronicity pattern, unspecified headache type  Dehydration     Rx / DC Orders ED Discharge Orders         Ordered    cephALEXin (KEFLEX) 500 MG capsule  4 times daily     02/28/20 2233    metroNIDAZOLE (FLAGYL) 500 MG tablet  2 times daily     02/28/20 2233    ondansetron (ZOFRAN ODT) 4 MG disintegrating tablet  Every 8 hours PRN     02/28/20 2234           Gailen Shelter, [place] 02/28/20 2234    Gerhard Munch, MD 02/28/20 2321

## 2020-02-28 NOTE — Discharge Instructions (Addendum)
Take Keflex for the next 2 weeks you have to take it 4 times daily.  Please take as prescribed.  This is for your pyelonephritis.  Please take the complete course or it may come back and be worse than before and they be more painful and more difficult to treat.  Take Flagyl for the next week will be twice daily.  Do not drink any alcohol as it will make you vomit horribly.  I am also prescribing you Zofran which you can use for any nausea that you have.  You had a mildly elevated bilirubin today which is most likely because you have not been eating however you should have this rechecked by your primary care doctor when you follow-up with them.

## 2020-02-28 NOTE — ED Triage Notes (Signed)
Pt arrived [facility] and stated her right side has been hurting for 1 week down to her pelvic area. Patient stated she has pressure and have a urgency to urinate. Pain 10/10. Takes Tylenol 500mg .

## 2020-02-28 NOTE — ED Notes (Signed)
Pt transported to CT ?

## 2020-02-29 LAB — SARS CORONAVIRUS 2 (TAT 6-24 HRS): SARS Coronavirus 2: NEGATIVE

## 2020-03-01 LAB — GC/CHLAMYDIA PROBE AMP (~~LOC~~) NOT AT ARMC
Chlamydia: NEGATIVE
Neisseria Gonorrhea: NEGATIVE

## 2020-03-02 LAB — URINE CULTURE: Culture: >=100000 — AB

## 2020-03-03 ENCOUNTER — Telehealth: Payer: Self-pay | Admitting: *Deleted

## 2020-03-03 NOTE — Telephone Encounter (Signed)
Post ED Visit - Positive Culture Follow-up  Culture report reviewed by antimicrobial stewardship pharmacist: [facility] []  , Pharm.D. []  Enzo Bi, Pharm.D., BCPS AQ-ID []  , Pharm.D., BCPS []  Celedonio Miyamoto, Pharm.D., BCPS []  [place], Garvin Fila.D., BCPS, AAHIVP []  , Pharm.D., BCPS, AAHIVP []  [place] Pillion, PharmD, BCPS []  , PharmD, BCPS []  [place], PharmD, BCPS []  [place], PharmD []  , PharmD, BCPS []  Estella Husk, PharmD  Pharmacy Team []  Lysle Pearl, PharmD []  , PharmD []  Phillips Climes, PharmD []  , Rph []  Agapito Games) , PharmD []  Verlan Friends, PharmD []  , PharmD []  Mervyn Gay, PharmD []  , PharmD []  Vinnie Level, PharmD []  [facility], PharmD []  , PharmD []  Len Childs, PharmD   , PharmD  Positive urine culture Treated with Cephalexin, organism sensitive to the same and no further patient follow-up is required at this time.  Greer Pickerel [facility] 03/03/2020, 10:46 AM

## 2020-03-11 DIAGNOSIS — F4310 Post-traumatic stress disorder, unspecified: Secondary | ICD-10-CM | POA: Diagnosis not present

## 2020-03-11 DIAGNOSIS — F331 Major depressive disorder, recurrent, moderate: Secondary | ICD-10-CM | POA: Diagnosis not present

## 2020-03-15 DIAGNOSIS — F4310 Post-traumatic stress disorder, unspecified: Secondary | ICD-10-CM | POA: Diagnosis not present

## 2020-03-15 DIAGNOSIS — F331 Major depressive disorder, recurrent, moderate: Secondary | ICD-10-CM | POA: Diagnosis not present

## 2020-03-21 DIAGNOSIS — F4310 Post-traumatic stress disorder, unspecified: Secondary | ICD-10-CM | POA: Diagnosis not present

## 2020-03-21 DIAGNOSIS — F331 Major depressive disorder, recurrent, moderate: Secondary | ICD-10-CM | POA: Diagnosis not present

## 2020-03-25 DIAGNOSIS — F4310 Post-traumatic stress disorder, unspecified: Secondary | ICD-10-CM | POA: Diagnosis not present

## 2020-03-25 DIAGNOSIS — F331 Major depressive disorder, recurrent, moderate: Secondary | ICD-10-CM | POA: Diagnosis not present

## 2020-03-28 DIAGNOSIS — F4310 Post-traumatic stress disorder, unspecified: Secondary | ICD-10-CM | POA: Diagnosis not present

## 2020-03-28 DIAGNOSIS — F331 Major depressive disorder, recurrent, moderate: Secondary | ICD-10-CM | POA: Diagnosis not present

## 2020-04-11 DIAGNOSIS — F4310 Post-traumatic stress disorder, unspecified: Secondary | ICD-10-CM | POA: Diagnosis not present

## 2020-04-11 DIAGNOSIS — F331 Major depressive disorder, recurrent, moderate: Secondary | ICD-10-CM | POA: Diagnosis not present

## 2020-05-25 IMAGING — CT CT ABD-PELV W/O CM
2 of 4 series · 16 of 46 positions shown, 18 images · non-contrast
Comparison: None.

CLINICAL DATA: Right lower quadrant abdominal pain and flank pain.
Renal stone disease versus appendicitis.

EXAM:
CT ABDOMEN AND PELVIS WITHOUT CONTRAST
TECHNIQUE: Multidetector CT imaging of the abdomen and pelvis was performed
following the standard protocol without IV contrast.

[Series 3: a/p w/o 5mm · axial · non-contrast · 0.70mm/px · z∈[+438,+793]mm · 13 of 79 slices shown, 15 images]
[im 4/79  soft-tissue]
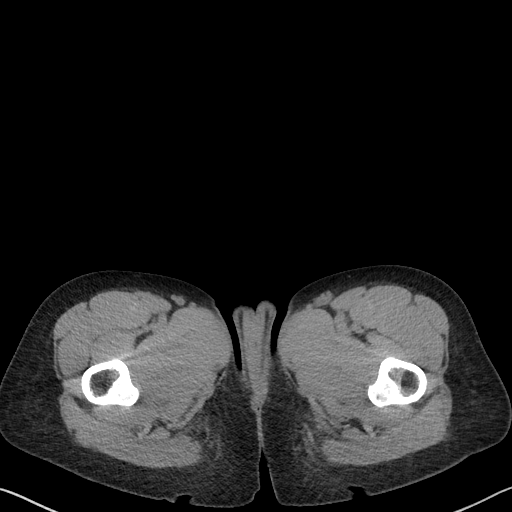
[im 4/79  bone]
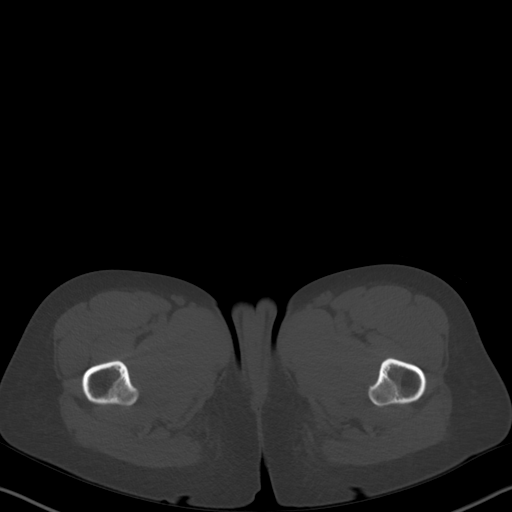
[im 10/79  soft-tissue]
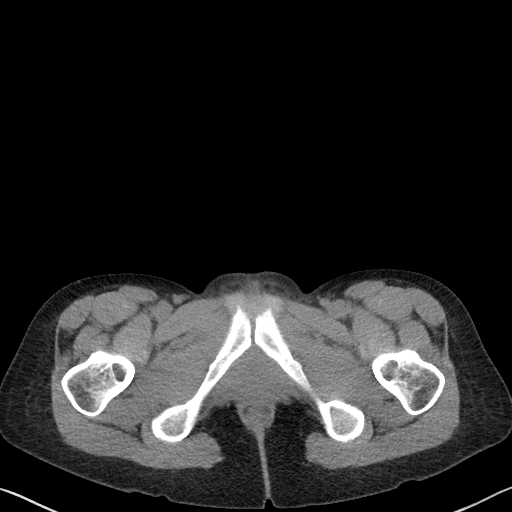
[im 16/79  soft-tissue]
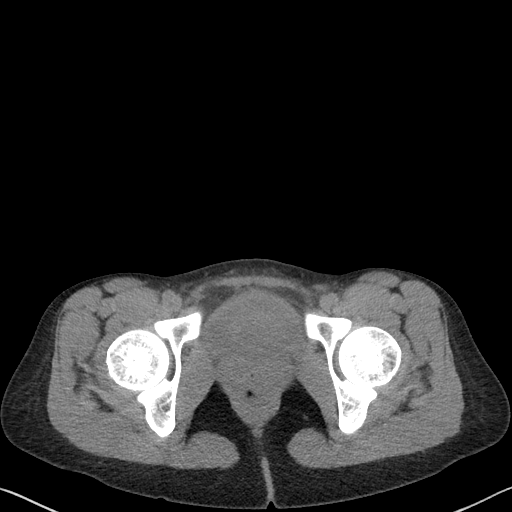
[im 22/79  soft-tissue]
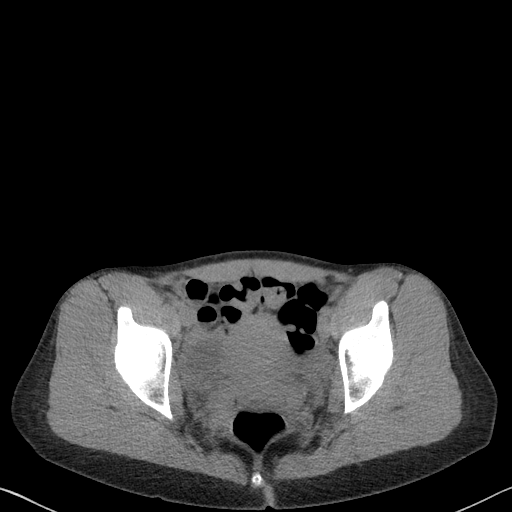
[im 29/79  soft-tissue]
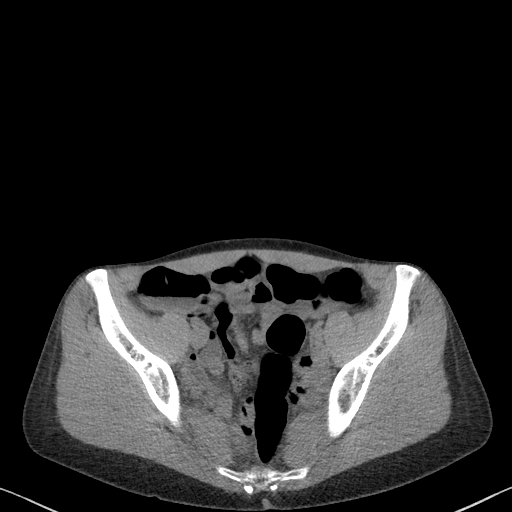
[im 35/79  soft-tissue]
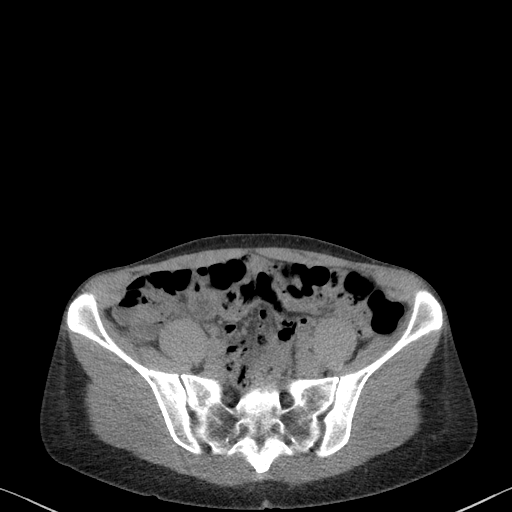
[im 41/79  soft-tissue]
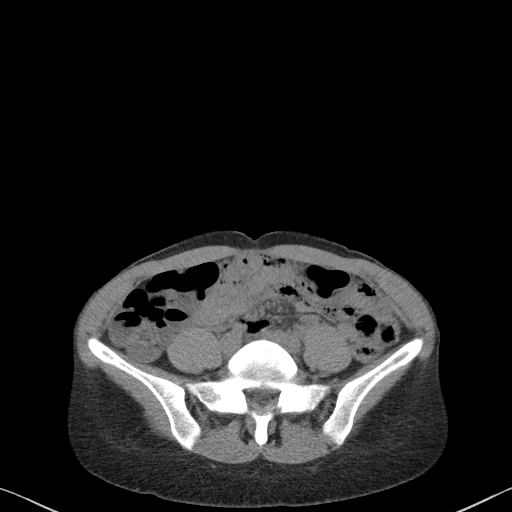
[im 44/79  soft-tissue]
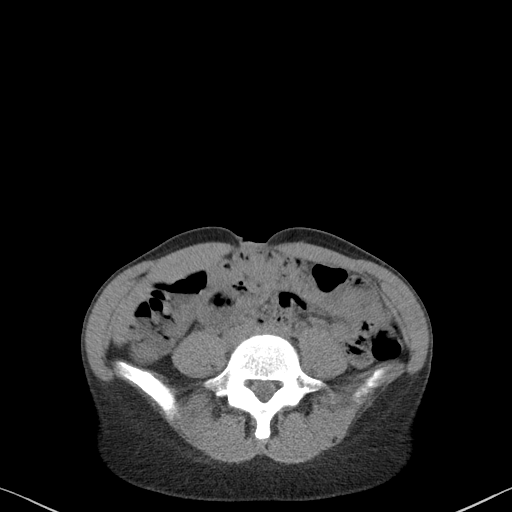
[im 50/79  soft-tissue]
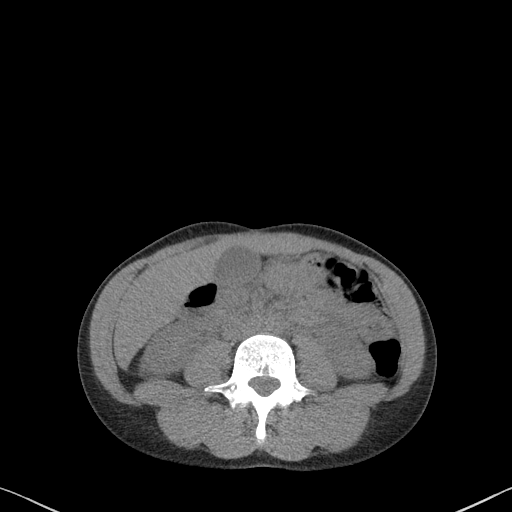
[im 50/79  bone]
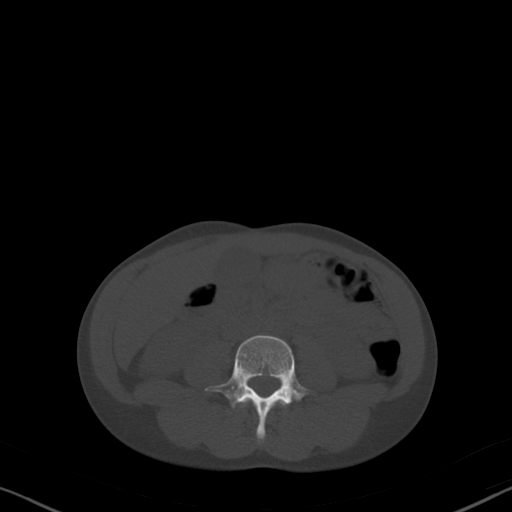
[im 57/79  soft-tissue]
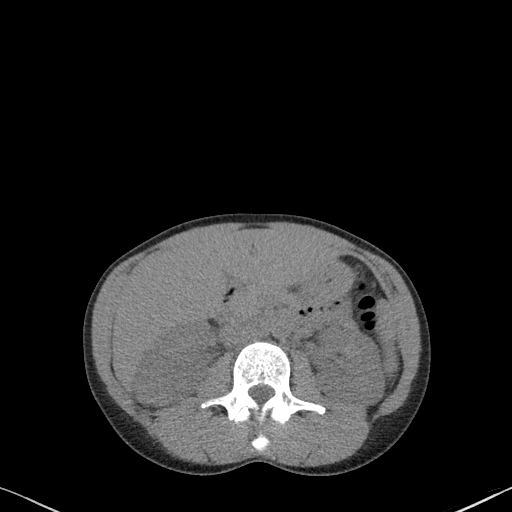
[im 63/79  soft-tissue]
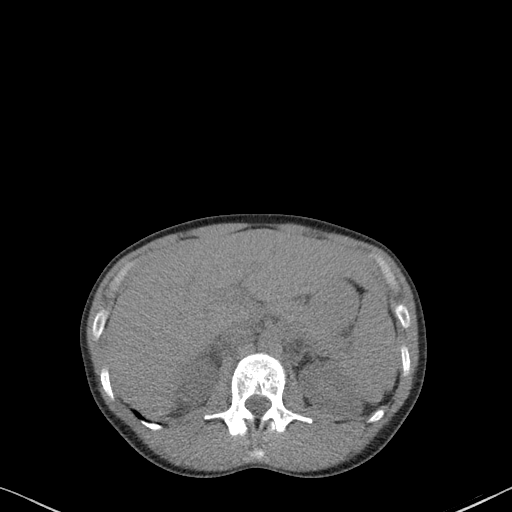
[im 69/79  soft-tissue]
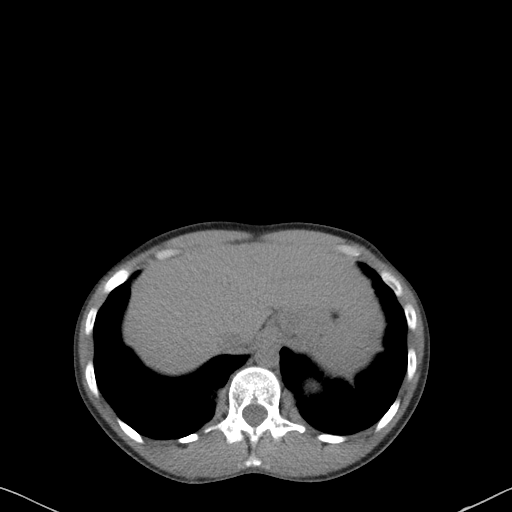
[im 75/79  soft-tissue]
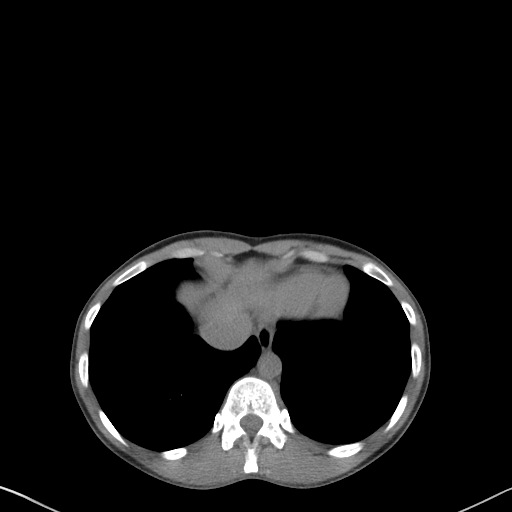

[Series 6: a/p w/o cor · coronal · non-contrast · 0.60mm/px · 3 of 101 slices shown]
[im 34/101  soft-tissue]
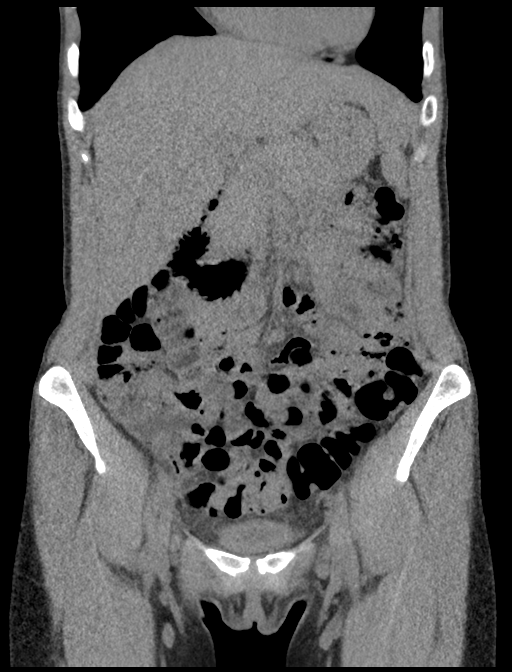
[im 45/101  soft-tissue]
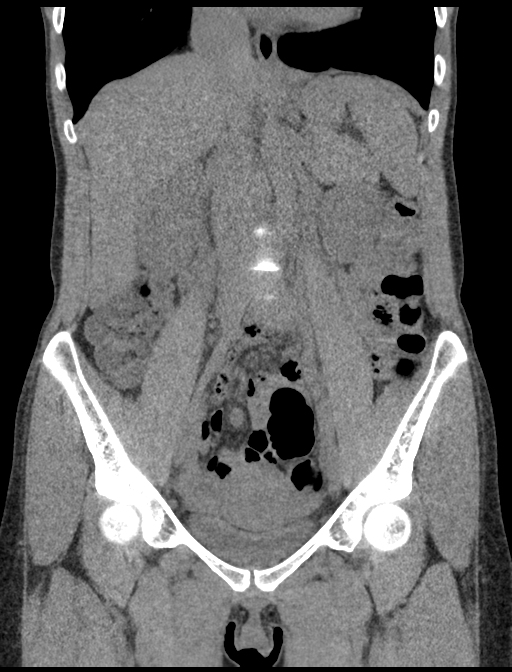
[im 56/101  soft-tissue]
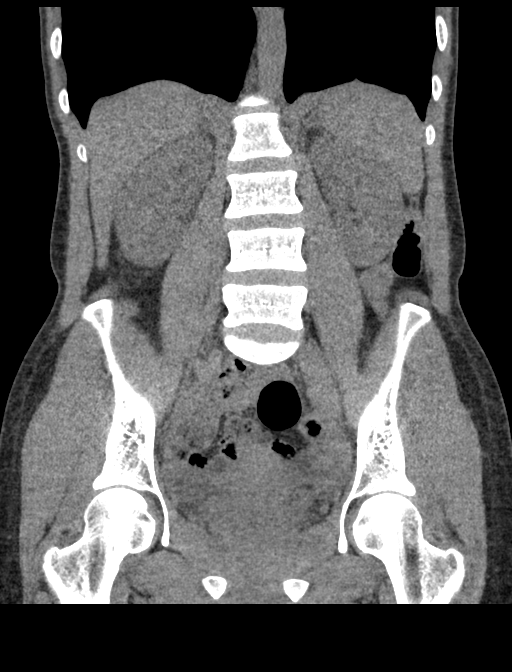

[16 of 46 positions shown; findings below may reference images not displayed]

FINDINGS: Lower chest: Normal

Hepatobiliary: Renal parenchyma appears normal. No calcified
gallstones.

Pancreas: Normal

Spleen: Normal

Adrenals/Urinary Tract: No adrenal lesion is seen. Kidneys appear
normal in size and position. No renal calculi. No stones seen along
the course of either ureter. No stone in the bladder. Few
phleboliths in the pelvis.

Stomach/Bowel: The appendix is normal. No other visible bowel
pathology.

Vascular/Lymphatic: No abnormal vascular finding. Virtually absent
retroperitoneal fat.

Reproductive: No pelvic mass.

Other: No free fluid or air.

Musculoskeletal: Normal
IMPRESSION: Normal appearing appendix. No evidence of urinary tract pathology.
No sign of other bowel pathology or solid organ pathology. There is
a paucity of fat.
